# Patient Record
Sex: Male | Born: 1975 | Race: White | Hispanic: Refuse to answer | Smoking: Current some day smoker
Health system: Southern US, Community
[De-identification: ages and names within clinical notes are randomized; demographics above are authoritative.]

## PROBLEM LIST (undated history)

## (undated) DIAGNOSIS — F419 Anxiety disorder, unspecified: Secondary | ICD-10-CM

## (undated) HISTORY — DX: Anxiety disorder, unspecified: F41.9

---

## 2001-08-13 DIAGNOSIS — M25512 Pain in left shoulder: Secondary | ICD-10-CM | POA: Insufficient documentation

## 2016-06-19 ENCOUNTER — Other Ambulatory Visit (HOSPITAL_COMMUNITY): Payer: Self-pay | Admitting: Internal Medicine

## 2016-06-19 ENCOUNTER — Ambulatory Visit (HOSPITAL_COMMUNITY)
Admission: RE | Admit: 2016-06-19 | Discharge: 2016-06-19 | Disposition: A | Discharge status: Home / Self Care | Payer: BLUE CROSS/BLUE SHIELD | Source: Ambulatory Visit | Attending: Internal Medicine | Admitting: Internal Medicine

## 2016-06-19 DIAGNOSIS — R5383 Other fatigue: Secondary | ICD-10-CM

## 2016-06-19 DIAGNOSIS — R0789 Other chest pain: Secondary | ICD-10-CM | POA: Insufficient documentation

## 2016-12-18 ENCOUNTER — Other Ambulatory Visit: Payer: Self-pay | Admitting: Internal Medicine

## 2016-12-18 DIAGNOSIS — R1084 Generalized abdominal pain: Secondary | ICD-10-CM

## 2016-12-27 ENCOUNTER — Ambulatory Visit
Admission: RE | Admit: 2016-12-27 | Discharge: 2016-12-27 | Disposition: A | Discharge status: Home / Self Care | Payer: No Typology Code available for payment source | Source: Ambulatory Visit | Attending: Internal Medicine | Admitting: Internal Medicine

## 2016-12-27 DIAGNOSIS — R1084 Generalized abdominal pain: Secondary | ICD-10-CM

## 2019-02-19 ENCOUNTER — Ambulatory Visit (INDEPENDENT_AMBULATORY_CARE_PROVIDER_SITE_OTHER): Payer: Self-pay | Admitting: Otolaryngology

## 2019-02-19 DIAGNOSIS — D3709 Neoplasm of uncertain behavior of other specified sites of the oral cavity: Secondary | ICD-10-CM

## 2019-09-09 ENCOUNTER — Other Ambulatory Visit: Payer: Self-pay

## 2019-09-09 ENCOUNTER — Encounter (INDEPENDENT_AMBULATORY_CARE_PROVIDER_SITE_OTHER): Payer: Self-pay | Admitting: Internal Medicine

## 2019-09-09 ENCOUNTER — Ambulatory Visit (INDEPENDENT_AMBULATORY_CARE_PROVIDER_SITE_OTHER): Payer: Self-pay | Admitting: Internal Medicine

## 2019-09-09 VITALS — BP 132/84 | HR 78 | Temp 98.0°F | Resp 18 | Ht 72.0 in | Wt 202.0 lb

## 2019-09-09 DIAGNOSIS — F410 Panic disorder [episodic paroxysmal anxiety] without agoraphobia: Secondary | ICD-10-CM

## 2019-09-09 DIAGNOSIS — F419 Anxiety disorder, unspecified: Secondary | ICD-10-CM

## 2019-09-09 MED ORDER — LORAZEPAM 0.5 MG PO TABS: 0.5000 mg | ORAL_TABLET | Freq: Two times a day (BID) | ORAL | 1 refills | Status: DC | PRN

## 2019-09-09 MED ORDER — ESCITALOPRAM OXALATE 5 MG PO TABS: 5.0000 mg | ORAL_TABLET | Freq: Every day | ORAL | 3 refills | Status: DC

## 2019-09-09 NOTE — Progress Notes (Signed)
Metrics: Intervention Frequency ACO  Documented Smoking Status Yearly  Screened one or more times in 24 months  Cessation Counseling or  Active cessation medication Past 24 months  Past 24 months   Guideline developer: UpToDate (See UpToDate for funding source) Date Released: 2014       Wellness Office Visit  Subjective:  Patient ID: Benjamin Waters, male    DOB: Nov 06, 1975  Age: 44 y.o. MRN: [id number]  CC: This is an acute visit with a chief complaint of anxiety. HPI  For the last several months, during the COVID-19 pandemic especially, the patient has been experiencing episodes of severe anxiety with panic attacks.  He notices palpitations, extreme anxiety, feels sometimes he is going to die.  The episodes usually are short lived and they improve and he has had these in the past and taken benzodiazepines with significant relief. 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  His fiance makes minimal income and together they are just about able to pay the mortgage.  They also have a son who is 25 months old now. He has never been in a depressed state that he is suicidal. History reviewed. No pertinent past medical history.    History reviewed. No pertinent family history.  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   Social History   Tobacco Use  . Smoking status: Former Smoker    Quit date: 09/09/2015    Years since quitting: 4.0  . Smokeless tobacco: Never Used  Substance Use Topics  . Alcohol use: Not on file    No outpatient medications have been marked as taking for the 09/09/19 encounter (Office Visit) with Doree Albee, MD.      Objective:   Today's Vitals: BP 132/84 (BP Location: Right Arm, Patient Position: Sitting, Cuff Size: Normal)   Pulse 78   Temp 98 F (36.7 C)   Resp 18   Wt 202  lb (91.6 kg)   SpO2 98% Comment: wearing a mask. Vitals with BMI 09/09/2019  Weight 123XX123 lbs  Systolic [id number]  Diastolic 84  Pulse 78     Physical Exam  He looks systemically well.  He appears to be calm during the visit.     Assessment   1. Anxiety   2. Panic attack       Tests ordered No orders of the defined types were placed in this encounter.    Plan: 1.  I meant to prescribe him Lexapro 5 mg daily to help him with ongoing anxiety and also lorazepam when he gets severe panic attacks.  He understands lorazepam cannot be addictive and he will try not to take more than absolutely necessary. 2.  He will follow-up with me as needed and he knows that he needs to schedule an annual physical when he is able from a financial standpoint.     Meds ordered this encounter  Medications  . escitalopram (LEXAPRO) 5 MG tablet    Sig: Take 1 tablet (5 mg total) by mouth daily.    Dispense:  30 tablet    Refill:  3  . LORazepam (ATIVAN) 0.5 MG tablet    Sig: Take 1 tablet (0.5 mg total) by mouth 2 (two) times daily as needed for anxiety.    Dispense:  30 tablet    Refill:  1  Doree Albee, MD

## 2019-09-09 NOTE — Patient Instructions (Signed)
'  MATING IN CAPTIVITY'

## 2020-02-23 ENCOUNTER — Ambulatory Visit (INDEPENDENT_AMBULATORY_CARE_PROVIDER_SITE_OTHER): Payer: Self-pay | Admitting: Internal Medicine

## 2020-02-23 ENCOUNTER — Other Ambulatory Visit: Payer: Self-pay

## 2020-02-23 ENCOUNTER — Encounter (INDEPENDENT_AMBULATORY_CARE_PROVIDER_SITE_OTHER): Payer: Self-pay | Admitting: Internal Medicine

## 2020-02-23 VITALS — BP 120/80 | HR 72 | Temp 97.5°F | Ht 74.0 in | Wt 216.2 lb

## 2020-02-23 DIAGNOSIS — Z0000 Encounter for general adult medical examination without abnormal findings: Secondary | ICD-10-CM

## 2020-02-23 MED ORDER — LORAZEPAM 0.5 MG PO TABS: 0.5000 mg | ORAL_TABLET | Freq: Two times a day (BID) | ORAL | 1 refills | Status: DC | PRN

## 2020-02-23 NOTE — Progress Notes (Signed)
Chief Complaint: This 44 year old man comes in for an annual physical exam. HPI: He is doing reasonably well.  He cannot tolerate Lexapro and he takes lorazepam maybe twice a week when he really gets significant anxiety. Otherwise, he does not have any major complaints.  Past Medical History:  Diagnosis Date   Anxiety    History reviewed. No pertinent surgical history.   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    Social History   Tobacco Use   Smoking status: Former Smoker    Quit date: 09/09/2015    Years since quitting: 4.4   Smokeless tobacco: Never Used  Substance Use Topics   Alcohol use: Yes    Alcohol/week: 10.0 standard drinks    Types: 10 Cans of beer per week      Allergies: No Known Allergies   Current Meds  Medication Sig   LORazepam (ATIVAN) 0.5 MG tablet Take 1 tablet (0.5 mg total) by mouth 2 (two) times daily as needed for anxiety.   [DISCONTINUED] LORazepam (ATIVAN) 0.5 MG tablet Take 1 tablet (0.5 mg total) by mouth 2 (two) times daily as needed for anxiety.      Depression screen PHQ 2/9 02/23/2020  Decreased Interest 0  Down, Depressed, Hopeless 0  PHQ - 2 Score 0     WUG:QBVQX from the symptoms mentioned above,there are no other symptoms referable to all systems reviewed.       Physical Exam: Blood pressure 120/80, pulse 72, temperature (!) 97.5 F (36.4 C), temperature source Temporal, height 6\' 2"  (1.88 m), weight 216 lb 3.2 oz (98.1 kg), SpO2 96 %. Vitals with BMI 02/23/2020 09/09/2019  Height 6\' 2"  6\' 0"   Weight 216 lbs 3 oz 202 lbs  BMI 45.03 88.82  Systolic 800 349  Diastolic 80 84  Pulse 72 78      He looks systemically well.  He is overweight.  He has gained 14 pounds since January of this year. General: Alert, cooperative, and appears to be the stated age.No pallor.  No jaundice.  No clubbing. Head: Normocephalic Eyes:  Sclera white, pupils equal and reactive to light, red reflex x 2,  Ears: Normal bilaterally Oral cavity: Lips, mucosa, and tongue normal: Teeth and gums normal Neck: No adenopathy, supple, symmetrical, trachea midline, and thyroid does not appear enlarged Respiratory: Clear to auscultation bilaterally.No wheezing, crackles or bronchial breathing. Cardiovascular: Heart sounds are present and appear to be normal without murmurs or added sounds.  No carotid bruits.  Peripheral pulses are present and equal bilaterally.: Gastrointestinal:positive bowel sounds, no hepatosplenomegaly.  No masses felt.No tenderness. Skin: Clear, No rashes noted.No worrisome skin lesions seen. Neurological: Grossly intact without focal findings, cranial nerves II through XII intact, muscle strength equal bilaterally Musculoskeletal: No acute joint abnormalities noted.Full range of movement noted with joints. Psychiatric: Affect appropriate, non-anxious.    Assessment  1. Routine general medical examination at a health care facility     Tests Ordered:   Orders Placed This Encounter  Procedures   CBC   COMPLETE METABOLIC PANEL WITH GFR   Lipid panel     Plan  1. Fairly healthy 44 year old man but is overweight and he realizes he needs to lose weight. 2. I have refilled his lorazepam. 3. Further recommendations will depend on blood results. 4. I will see him in 1 year for an annual physical exam.     Meds ordered this encounter  Medications   LORazepam (ATIVAN) 0.5 MG tablet    Sig: Take 1 tablet (0.5 mg total) by mouth 2 (two) times daily as needed for anxiety.    Dispense:  30 tablet    Refill:  1     Keyari Kleeman C Vito Beg   02/23/2020, 9:21 AM

## 2020-02-24 LAB — CBC
HCT: 44.9 % (ref 38.5–50.0)
Hemoglobin: 14.7 g/dL (ref 13.2–17.1)
MCH: 30.9 pg (ref 27.0–33.0)
MCHC: 32.7 g/dL (ref 32.0–36.0)
MCV: 94.3 fL (ref 80.0–100.0)
MPV: 10.1 fL (ref 7.5–12.5)
Platelets: 216 10*3/uL (ref 140–400)
RBC: 4.76 10*6/uL (ref 4.20–5.80)
RDW: 13.3 % (ref 11.0–15.0)
WBC: 5.7 10*3/uL (ref 3.8–10.8)

## 2020-02-24 LAB — COMPLETE METABOLIC PANEL WITH GFR
AG Ratio: 1.9 (calc) (ref 1.0–2.5)
ALT: 21 U/L (ref 9–46)
AST: 15 U/L (ref 10–40)
Albumin: 4.2 g/dL (ref 3.6–5.1)
Alkaline phosphatase (APISO): 47 U/L (ref 36–130)
BUN: 11 mg/dL (ref 7–25)
CO2: 25 mmol/L (ref 20–32)
Calcium: 9.2 mg/dL (ref 8.6–10.3)
Chloride: 106 mmol/L (ref 98–110)
Creat: 0.97 mg/dL (ref 0.60–1.35)
GFR, Est African American: 111 mL/min/{1.73_m2} (ref >=60)
GFR, Est Non African American: 96 mL/min/{1.73_m2} (ref >=60)
Globulin: 2.2 g/dL (calc) (ref 1.9–3.7)
Glucose, Bld: 92 mg/dL (ref 65–99)
Potassium: 4.1 mmol/L (ref 3.5–5.3)
Sodium: 139 mmol/L (ref 135–146)
Total Bilirubin: 0.5 mg/dL (ref 0.2–1.2)
Total Protein: 6.4 g/dL (ref 6.1–8.1)

## 2020-02-24 LAB — LIPID PANEL
Cholesterol: 144 mg/dL (ref <200)
HDL: 29 mg/dL — ABNORMAL LOW (ref >=40)
LDL Cholesterol (Calc): 89 mg/dL (calc)
Non-HDL Cholesterol (Calc): 115 mg/dL (calc) (ref <130)
Total CHOL/HDL Ratio: 5 (calc) — ABNORMAL HIGH (ref <5.0)
Triglycerides: 164 mg/dL — ABNORMAL HIGH (ref <150)

## 2020-02-24 NOTE — Progress Notes (Signed)
Please call this patient and let him know that his blood tests are within normal range and cholesterol is not too elevated.  His HDL cholesterol is low.  Exercise and following a healthy diet will help this.  Follow-up as scheduled.

## 2020-02-24 NOTE — Progress Notes (Signed)
Patient called.  Given lab results. Pt stated he is going to do the exercises and diet.

## 2020-05-31 ENCOUNTER — Other Ambulatory Visit (INDEPENDENT_AMBULATORY_CARE_PROVIDER_SITE_OTHER): Payer: Self-pay | Admitting: Internal Medicine

## 2020-07-20 ENCOUNTER — Other Ambulatory Visit (INDEPENDENT_AMBULATORY_CARE_PROVIDER_SITE_OTHER): Payer: Self-pay | Admitting: Internal Medicine

## 2020-09-19 ENCOUNTER — Other Ambulatory Visit (INDEPENDENT_AMBULATORY_CARE_PROVIDER_SITE_OTHER): Payer: Self-pay | Admitting: Internal Medicine

## 2020-11-14 ENCOUNTER — Other Ambulatory Visit (INDEPENDENT_AMBULATORY_CARE_PROVIDER_SITE_OTHER): Payer: Self-pay | Admitting: Internal Medicine

## 2021-02-27 ENCOUNTER — Encounter (INDEPENDENT_AMBULATORY_CARE_PROVIDER_SITE_OTHER): Payer: Medicaid Other | Admitting: Internal Medicine

## 2021-03-16 ENCOUNTER — Encounter (INDEPENDENT_AMBULATORY_CARE_PROVIDER_SITE_OTHER): Payer: Self-pay

## 2021-03-22 ENCOUNTER — Encounter (INDEPENDENT_AMBULATORY_CARE_PROVIDER_SITE_OTHER): Payer: Medicaid Other | Admitting: Nurse Practitioner

## 2021-05-31 ENCOUNTER — Ambulatory Visit (INDEPENDENT_AMBULATORY_CARE_PROVIDER_SITE_OTHER): Payer: Medicaid Other | Admitting: Internal Medicine

## 2021-05-31 ENCOUNTER — Other Ambulatory Visit: Payer: Self-pay

## 2021-05-31 ENCOUNTER — Encounter: Payer: Self-pay | Admitting: Internal Medicine

## 2021-05-31 VITALS — BP 138/84 | HR 69 | Temp 98.0°F | Resp 16 | Ht 74.0 in | Wt 201.0 lb

## 2021-05-31 DIAGNOSIS — F411 Generalized anxiety disorder: Secondary | ICD-10-CM | POA: Diagnosis not present

## 2021-05-31 DIAGNOSIS — Z7689 Persons encountering health services in other specified circumstances: Secondary | ICD-10-CM | POA: Insufficient documentation

## 2021-05-31 DIAGNOSIS — J309 Allergic rhinitis, unspecified: Secondary | ICD-10-CM | POA: Diagnosis not present

## 2021-05-31 DIAGNOSIS — K5904 Chronic idiopathic constipation: Secondary | ICD-10-CM | POA: Insufficient documentation

## 2021-05-31 DIAGNOSIS — R569 Unspecified convulsions: Secondary | ICD-10-CM | POA: Insufficient documentation

## 2021-05-31 DIAGNOSIS — Z8673 Personal history of transient ischemic attack (TIA), and cerebral infarction without residual deficits: Secondary | ICD-10-CM | POA: Insufficient documentation

## 2021-05-31 MED ORDER — CETIRIZINE HCL 10 MG PO TABS: 10.0000 mg | ORAL_TABLET | Freq: Every day | ORAL | 3 refills | Status: DC

## 2021-05-31 MED ORDER — FLUTICASONE PROPIONATE 50 MCG/ACT NA SUSP: 2.0000 | Freq: Every day | NASAL | 6 refills | Status: DC

## 2021-05-31 NOTE — Assessment & Plan Note (Signed)
Started Zyrtec 10 mg PRN Started Flonase

## 2021-05-31 NOTE — Assessment & Plan Note (Signed)
In 2008, had right sided hamiparesis and hemiplegia - lasted for 3 months, reports that he could not see Neurologist as he did not have insurance - did not have any imaging Advised to start Aspirin for now Referred to Neurology - possibly will need MRI brain to confirm old CVA - if evident, will start statin

## 2021-05-31 NOTE — Patient Instructions (Signed)
Please take Zyrtec and use Flonase for allergic sinusitis.  Please use Senna or Colace for constipation. Try to take at least 64 ounces of fluid in a day.  Start taking Aspirin 81 mg once a day.  You are being referred to Neurology.

## 2021-05-31 NOTE — Assessment & Plan Note (Signed)
Likely panic episodes - Ativan PRN Did not tolerate SSRI in the past

## 2021-05-31 NOTE — Assessment & Plan Note (Signed)
Care established History and medications reviewed with the patient 

## 2021-05-31 NOTE — Progress Notes (Signed)
New Patient Office Visit  Subjective:  Patient ID: Benjamin Waters, male    DOB: 1975-12-08  Age: 45 y.o. MRN: [id number]  CC:  Chief Complaint  Patient presents with   New Patient (Initial Visit)    New patient was seeing dr Benjamin Waters pt has had sinus congestion for last 2 months runny nose and thick mucous     HPI Benjamin Waters is a 45 year old male with PMH of GAD/panic disorder, ?  CVA and allergic sinusitis who presents for establishing care.  He has history of panic episodes, but does not recall any triggers.  He denies any dyspnea during the episode, but feels enclosed in a space, and has headache, chest pain, diffuse muscle aches and blurry vision. He also reports clenching of his hands and jerking of UE. Of note, he takes Ativan for such episodes, which arguable can treat episodes of seizure like movement as well. He has had these episodes for more than 5 years, but has been getting progressively worse and more frequent.  He also reports an episode of facial weakness and numbness in 2008, for which he went to Urgent care and was told of Bell's palsy. But later, he had right sided hemiparesis and hemiplegia (?), but did not get any medical attention as he did not have any insurance at that time. He had weakness or right UE and LE for almost 3 months. He has gained his strength back since then. Denies any numbness or tingling currently.  He c/o chronic nasal congestion and thick nasal discharge at times. He also sinus pressure at times. Denies any fever, chills, dyspnea or wheezing.  He also c/o chronic constipation, but denies any melena. He has had an episode of rectal bleeding few weeks ago, where he noticed blood on tissue paper. He also reports remote history of hemorrhoids.  He has had 2 doses of COVID vaccine.  Past Medical History:  Diagnosis Date   Anxiety     History reviewed. No pertinent surgical history.  Family History  Problem Relation Age of Onset   Lung  cancer Father    Hypertension Sister    Cancer Sister    Drug abuse Sister     Social History   Socioeconomic History   Marital status: Soil scientist    Spouse name: Not on file   Number of children: Not on file   Years of education: Not on file   Highest education level: Not on file  Occupational History   Not on file  Tobacco Use   Smoking status: Former    Types: Cigarettes    Quit date: 09/09/2015    Years since quitting: 5.7   Smokeless tobacco: Never  Substance and Sexual Activity   Alcohol use: Yes    Alcohol/week: 10.0 standard drinks    Types: 10 Cans of beer per week   Drug use: Not Currently   Sexual activity: Yes  Other Topics Concern   Not on file  Social History Narrative   Benjamin Waters of 11 years.Lives with fiancee.Currently unemployed.Thinking of going into IT.   Social Determinants of Health   Financial Resource Strain: Not on file  Food Insecurity: Not on file  Transportation Needs: Not on file  Physical Activity: Not on file  Stress: Not on file  Social Connections: Not on file  Intimate Partner Violence: Not on file    ROS Review of Systems  Constitutional:  Negative for chills and fever.  HENT:  Positive for congestion, postnasal drip and  sinus pressure. Negative for sore throat.   Eyes:  Negative for pain and discharge.  Respiratory:  Negative for cough and shortness of breath.   Cardiovascular:  Negative for chest pain and palpitations.  Gastrointestinal:  Positive for constipation. Negative for diarrhea, nausea and vomiting.  Endocrine: Negative for polydipsia and polyuria.  Genitourinary:  Negative for dysuria and hematuria.  Musculoskeletal:  Negative for neck pain and neck stiffness.  Skin:  Negative for rash.  Neurological:  Positive for dizziness and weakness. Negative for numbness and headaches.  Psychiatric/Behavioral:  Negative for agitation and behavioral problems. The patient is nervous/anxious.    Objective:   Today's  Vitals: BP 138/84 (BP Location: Left Arm, Cuff Size: Normal)   Pulse 69   Temp 98 F (36.7 C) (Oral)   Resp 16   Ht 6\' 2"  (1.88 m)   Wt 201 lb 0.6 oz (91.2 kg)   SpO2 96%   BMI 25.81 kg/m   Physical Exam Vitals reviewed.  Constitutional:      General: He is not in acute distress.    Appearance: He is not diaphoretic.  HENT:     Head: Normocephalic and atraumatic.     Nose: Nose normal.     Mouth/Throat:     Mouth: Mucous membranes are moist.  Eyes:     General: No scleral icterus.    Extraocular Movements: Extraocular movements intact.  Cardiovascular:     Rate and Rhythm: Normal rate and regular rhythm.     Pulses: Normal pulses.     Heart sounds: Normal heart sounds. No murmur heard. Pulmonary:     Breath sounds: Normal breath sounds. No wheezing or rales.  Abdominal:     Palpations: Abdomen is soft.     Tenderness: There is no abdominal tenderness.  Musculoskeletal:     Cervical back: Neck supple. No tenderness.     Right lower leg: No edema.     Left lower leg: No edema.  Skin:    General: Skin is warm.     Findings: No rash.  Neurological:     General: No focal deficit present.     Mental Status: He is alert and oriented to person, place, and time.     Cranial Nerves: No cranial nerve deficit.     Sensory: No sensory deficit.     Motor: No weakness.  Psychiatric:        Mood and Affect: Mood normal.        Behavior: Behavior normal.    Assessment & Plan:   Problem List Items Addressed This Visit       Encounter to establish care - Primary   Care established History and medications reviewed with the patient     GAD (generalized anxiety disorder)   Likely panic episodes - Ativan PRN Did not tolerate SSRI in the past     Seizure-like activity ([place])   Certain symptoms during panic episodes suggestive of seizure-like activity Referred to Neurology for possible need for EEG     Relevant Orders  Ambulatory referral to Neurology  History of CVA  (cerebrovascular accident)   In 2008, had right sided hamiparesis and hemiplegia - lasted for 3 months, reports that he could not see Neurologist as he did not have insurance - did not have any imaging Advised to start Aspirin for now Referred to Neurology - possibly will need MRI brain to confirm old CVA - if evident, will start statin     Relevant Orders  Ambulatory  referral to Neurology    Respiratory   Allergic sinusitis    Started Zyrtec 10 mg PRN Started Flonase      Relevant Medications   cetirizine (ZYRTEC) 10 MG tablet   fluticasone (FLONASE) 50 MCG/ACT nasal spray     Digestive   Chronic idiopathic constipation    Advised to take Colace or senna PRN Increase fluid intake        Outpatient Encounter Medications as of 05/31/2021  Medication Sig   cetirizine (ZYRTEC) 10 MG tablet Take 1 tablet (10 mg total) by mouth daily.   fluticasone (FLONASE) 50 MCG/ACT nasal spray Place 2 sprays into both nostrils daily.   LORazepam (ATIVAN) 0.5 MG tablet Take 1 tablet by mouth twice daily as needed for anxiety   No facility-administered encounter medications on file as of 05/31/2021.    Follow-up: Return in about 6 months (around 11/29/2021).   Lindell Spar, MD

## 2021-05-31 NOTE — Assessment & Plan Note (Signed)
Advised to take Colace or senna PRN Increase fluid intake

## 2021-05-31 NOTE — Assessment & Plan Note (Signed)
Certain symptoms during panic episodes suggestive of seizure-like activity Referred to Neurology for possible need for EEG

## 2021-06-02 ENCOUNTER — Telehealth: Payer: Self-pay | Admitting: Internal Medicine

## 2021-06-02 ENCOUNTER — Other Ambulatory Visit: Payer: Self-pay | Admitting: Internal Medicine

## 2021-06-02 DIAGNOSIS — F411 Generalized anxiety disorder: Secondary | ICD-10-CM

## 2021-06-02 MED ORDER — LORAZEPAM 0.5 MG PO TABS: 0.5000 mg | ORAL_TABLET | Freq: Two times a day (BID) | ORAL | 3 refills | Status: DC | PRN

## 2021-06-02 NOTE — Telephone Encounter (Signed)
Pt called in for refill on Lorazepam  [facility] needs authorization to fill

## 2021-06-05 ENCOUNTER — Ambulatory Visit: Payer: Medicaid Other | Admitting: Neurology

## 2021-06-05 ENCOUNTER — Encounter: Payer: Self-pay | Admitting: Neurology

## 2021-06-05 ENCOUNTER — Other Ambulatory Visit: Payer: Self-pay

## 2021-06-05 VITALS — BP 112/66 | HR 73 | Ht 74.0 in | Wt 206.2 lb

## 2021-06-05 DIAGNOSIS — R251 Tremor, unspecified: Secondary | ICD-10-CM

## 2021-06-05 DIAGNOSIS — R531 Weakness: Secondary | ICD-10-CM | POA: Diagnosis not present

## 2021-06-05 NOTE — Progress Notes (Signed)
NEUROLOGY CONSULTATION NOTE  Makell Cyr MRN: [id number] DOB: 1976-06-03  Referring provider: Dr. Ihor Dow Primary care provider: Dr. Ihor Dow  Reason for consult:  seizure-like activity  Dear Dr Posey Pronto:  Thank you for your kind referral of Demarkus Remmel for consultation of the above symptoms. Although his history is well known to you, please allow me to reiterate it for the purpose of our medical record. He is alone in the office today. Records and images were personally reviewed where available.   HISTORY OF PRESENT ILLNESS: This is a pleasant 45 year old ambidextrous right hand dominant man with a history of GAD/panic disorder, presenting for evaluation of seizure-like activity. He started having intermittent episodes around 5 years ago, that became progressively worse that they were occurring daily close to 2 years ago. He can sometimes tell when they would start, he would feel weird with a physical sensation that something is wrong with him. Sometimes there is a pressure on the left side of his chest and he feels his heart beating fast or irregularly. He has a wrist BP cuff at home and it would show an irregular rhythm. His upper body would then stiffen/contract with low amplitude shaking. His fists may clench. He has to consciously focus on his breathing and relax his body, which may or may not help. There would be no loss of consciousness but a few times he felt like he would pass out when he had gotten lightheaded. There was one time a few days ago where he had a weird taste in his mouth like metal and he noticed he was feeling nervous/anxious with sweat on his face. He attributed to the taste to shaving cream in his mouth. He was prescribed Ativan close to 2 years ago which has helped a lot. Episodes are not occurring daily, he has around 1-2 a month. No nocturnal episodes. He is able to catch it now and take the Ativan, if he does not take it soon enough, he takes an  additional dose. In the past 3 weeks, he has taken 10-14 tablets. He notices random muscle twitches on his chest, thumb, or different parts of his body. He injured his left shoulder which also causes some anxiety. He lives with his partner, 2 year old son, and 11yo nephew, and has not been told of any staring/unresponsive episodes. He denies any loss of time. He denies any significant headaches, dizziness, diplopia, dysarthria/dysphagia, bladder dysfunction. He has neck/back pain and constipation.   He reports an incident in 2008 when he woke up with the right side of his mouth paralyzed. He went to Urgent Care where "they thought it was Bell's palsy" and prescribed Prednisone. Over the next 1-2 days, he had progressive weakness on the right arm then right leg where he needed to pull his right leg up or use his left arm to lift the right. He could move his right hand but there was no feeling in it, he could not write with it. It took 3 months to improve, and was normal in 6 months. He still notices a little droop on the right side of his mouth when smiling and coordination on the right hand is not as good, but feels 98% better. He has occasional numbness and tingling in his fingers and toes. No falls. Sleep is good. Mood "depends on the day," he reports being very stressed. 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 Otherwise he had a normal birth and early  development.  There is no history of febrile convulsions, CNS infections such as meningitis/encephalitis, significant traumatic brain injuries, neurosurgical procedures.    PAST MEDICAL HISTORY: Past Medical History:  Diagnosis Date   Anxiety     PAST SURGICAL HISTORY: History reviewed. No pertinent surgical history.  MEDICATIONS: Current Outpatient Medications on File Prior to Visit  Medication Sig Dispense Refill   aspirin EC 81 MG tablet Take 81 mg by mouth daily. Swallow whole.     cetirizine (ZYRTEC) 10 MG tablet Take 1  tablet (10 mg total) by mouth daily. 30 tablet 3   fluticasone (FLONASE) 50 MCG/ACT nasal spray Place 2 sprays into both nostrils daily. 16 g 6   LORazepam (ATIVAN) 0.5 MG tablet Take 1 tablet (0.5 mg total) by mouth 2 (two) times daily as needed. for anxiety 30 tablet 3   No current facility-administered medications on file prior to visit.    ALLERGIES: No Known Allergies  FAMILY HISTORY: Family History  Problem Relation Age of Onset   Lung cancer Father    Hypertension Sister    Cancer Sister    Drug abuse Sister     SOCIAL HISTORY: Social History   Socioeconomic History   Marital status: Soil scientist    Spouse name: Not on file   Number of children: Not on file   Years of education: Not on file   Highest education level: Not on file  Occupational History   Not on file  Tobacco Use   Smoking status: Former    Types: Cigarettes    Quit date: 09/09/2015    Years since quitting: 5.7   Smokeless tobacco: Never  Substance and Sexual Activity   Alcohol use: Yes    Alcohol/week: 10.0 standard drinks    Types: 10 Cans of beer per week   Drug use: Not Currently   Sexual activity: Yes  Other Topics Concern   Not on file  Social History Narrative   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   Social Determinants of Health   Financial Resource Strain: Not on file  Food Insecurity: Not on file  Transportation Needs: Not on file  Physical Activity: Not on file  Stress: Not on file  Social Connections: Not on file  Intimate Partner Violence: Not on file     PHYSICAL EXAM: Vitals:   06/05/21 0850  BP: 112/66  Pulse: 73  SpO2: 99%   General: No acute distress Head:  Normocephalic/atraumatic Skin/Extremities: No rash, no edema Neurological Exam: Mental status: alert and oriented to person, place, and time, no dysarthria or aphasia, Fund of knowledge is appropriate.  Recent and remote memory are intact, 3/3 delayed  recall.  Attention and concentration are normal, 5/5 WORLD backwards.  Cranial nerves: CN I: not tested CN II: pupils equal, round and reactive to light, visual fields intact CN III, IV, VI:  full range of motion, no nystagmus, no ptosis CN V: facial sensation intact CN VII: upper and lower face symmetric CN VIII: hearing intact to conversation Bulk & Tone: normal, no fasciculations. Motor: 5/5 throughout with no pronator drift, good fine finger movements Sensation: intact to light touch, cold, pin, vibration sense.  No extinction to double simultaneous stimulation.  Romberg test negative Deep Tendon Reflexes: +2 throughout, no ankle clonus Plantar responses: downgoing bilaterally Cerebellar: no incoordination on finger to nose testing Gait: narrow-based and steady, able to tandem walk adequately. Tremor: none   IMPRESSION: This is a pleasant 45 year old  ambidextrous right hand dominant man with a history of GAD/panic disorder, presenting for evaluation of seizure-like activity. History reviewed, he reports a prolonged episode of right-sided weakness in 2008 involving the right face/arm/leg but did not seek medical attention at that time. The episodes he describes are more suggestive of anxiety-related events, we discussed carpopedal spasms (in his case mostly involving the upper body/hands), less likely seizure. He has around 1-2 episodes a month now. MRI brain without contrast will be ordered to assess for underlying structural abnormality, he is now on daily aspirin. EEG will be done for completion. Our office will call with results, if normal, follow-up as needed, he knows to call for any changes.   Thank you for allowing me to participate in the care of this patient. Please do not hesitate to call for any questions or concerns.   Ellouise Newer, M.D.  CC: Dr. Posey Pronto

## 2021-06-05 NOTE — Patient Instructions (Signed)
Good to meet you!  Schedule MRI brain without contrast  2. Schedule routine EEG  3. Our office will call with results, call for any changes

## 2021-06-21 ENCOUNTER — Ambulatory Visit: Payer: Medicaid Other | Admitting: Neurology

## 2021-06-21 ENCOUNTER — Other Ambulatory Visit: Payer: Self-pay

## 2021-06-21 DIAGNOSIS — R251 Tremor, unspecified: Secondary | ICD-10-CM

## 2021-06-21 DIAGNOSIS — R531 Weakness: Secondary | ICD-10-CM

## 2021-06-22 NOTE — Procedures (Signed)
ELECTROENCEPHALOGRAM REPORT  Date of Study: 06/21/2021  Patient's Name: Benjamin Waters MRN: [id number] Date of Birth: 1976/07/29  Referring Provider: Dr. Ellouise Newer  Clinical History: This is a 45 year old man with episodes of weird feeling with palpitations, upper body would contract/stiffen with slight shaking, fists clenched. EEG for classification.  Medications: ATIVAN 0.5 MG tablet aspirin EC 81 MG tablet ZYRTEC 10 MG tablet FLONASE 50 MCG/ACT nasal spray   Technical Summary: A multichannel digital recording measured by the international 10-20 system with electrodes applied with paste and impedances below 5000 ohms performed in our laboratory with EKG monitoring in an awake and askeeo patient.  Hyperventilation was not performed. Photic stimulation was performed.  The digital EEG was referentially recorded, reformatted, and digitally filtered in a variety of bipolar and referential montages for optimal display.    Description: The patient is awake and asleep during the recording.  During maximal wakefulness, there is a symmetric, medium voltage 10 Hz posterior dominant rhythm that attenuates with eye opening.  The record is symmetric.  During drowsiness and stage I sleep, there is an increase in theta slowing of the background with vertex waves seen.  Photic stimulation did not elicit any abnormalities.  There were no epileptiform discharges or electrographic seizures seen.    EKG lead was unremarkable.  Impression: This awake and asleep EEG is normal.    Clinical Correlation: A normal EEG does not exclude a clinical diagnosis of epilepsy.  If further clinical questions remain, prolonged EEG may be helpful.  Clinical correlation is advised.   Ellouise Newer, M.D.

## 2021-06-26 ENCOUNTER — Telehealth: Payer: Self-pay

## 2021-06-26 NOTE — Telephone Encounter (Signed)
-----   Message from Cameron Sprang, MD sent at 06/26/2021  8:32 AM EST ----- Pls let him know the brain wave test was normal, proceed with MRI as scheduled, thanks

## 2021-06-26 NOTE — Telephone Encounter (Signed)
Pt called no answer per DPR left a voice mail brain wave test was normal, proceed with MRI as scheduled. Any questions or concerns call the office back at [phone]

## 2021-07-08 ENCOUNTER — Other Ambulatory Visit: Payer: Self-pay

## 2021-07-08 ENCOUNTER — Ambulatory Visit
Admission: RE | Admit: 2021-07-08 | Discharge: 2021-07-08 | Disposition: A | Discharge status: Home / Self Care | Payer: Medicaid Other | Source: Ambulatory Visit | Attending: Neurology | Admitting: Neurology

## 2021-07-08 DIAGNOSIS — R251 Tremor, unspecified: Secondary | ICD-10-CM

## 2021-07-08 DIAGNOSIS — R531 Weakness: Secondary | ICD-10-CM

## 2021-07-14 ENCOUNTER — Encounter: Payer: Self-pay | Admitting: Neurology

## 2021-07-20 ENCOUNTER — Telehealth: Payer: Self-pay

## 2021-07-20 NOTE — Telephone Encounter (Signed)
Pt called an informed per Dr Delice Lesch taht she is  out of the office and will call him on Monday, but that typically when we see these changes on the MRI, we do a spinal tap to further clarify a diagnosis

## 2021-07-24 ENCOUNTER — Other Ambulatory Visit: Payer: Self-pay

## 2021-07-24 DIAGNOSIS — R251 Tremor, unspecified: Secondary | ICD-10-CM

## 2021-07-24 DIAGNOSIS — G35 Multiple sclerosis: Secondary | ICD-10-CM

## 2021-07-24 NOTE — Telephone Encounter (Signed)
Spoke to patient about MRI results concerning for demyelinating disease. Since his last visit, he has not had any new symptoms but after reading up on MS, he does notice numbness in tips of toes and fingers, fatigue, lightheadedness. He is still having the shaking (4 or 5 episodes). Discussed doing further workup for MS.  Heather, pls order: MRI cervical spine with and without contrast MRI thoracic spine with and without contrast Lumbar puncture under fluoro  CSF for cell count, glucose, protein CSF oligoclonal bands CSF IgG index CSF ACE    Thanks

## 2021-07-24 NOTE — Telephone Encounter (Signed)
Orders placed in Epic

## 2021-08-01 ENCOUNTER — Other Ambulatory Visit (HOSPITAL_COMMUNITY)
Admission: RE | Admit: 2021-08-01 | Discharge: 2021-08-01 | Disposition: A | Discharge status: Home / Self Care | Payer: Medicaid Other | Source: Ambulatory Visit | Attending: Neurology | Admitting: Neurology

## 2021-08-01 ENCOUNTER — Ambulatory Visit
Admission: RE | Admit: 2021-08-01 | Discharge: 2021-08-01 | Disposition: A | Discharge status: Home / Self Care | Payer: Medicaid Other | Source: Ambulatory Visit | Attending: Neurology | Admitting: Neurology

## 2021-08-01 VITALS — BP 121/67 | HR 55

## 2021-08-01 DIAGNOSIS — Z8673 Personal history of transient ischemic attack (TIA), and cerebral infarction without residual deficits: Secondary | ICD-10-CM | POA: Diagnosis not present

## 2021-08-01 DIAGNOSIS — R251 Tremor, unspecified: Secondary | ICD-10-CM | POA: Insufficient documentation

## 2021-08-01 DIAGNOSIS — G35 Multiple sclerosis: Secondary | ICD-10-CM | POA: Insufficient documentation

## 2021-08-01 DIAGNOSIS — H9319 Tinnitus, unspecified ear: Secondary | ICD-10-CM | POA: Insufficient documentation

## 2021-08-01 DIAGNOSIS — R569 Unspecified convulsions: Secondary | ICD-10-CM | POA: Diagnosis present

## 2021-08-01 DIAGNOSIS — G971 Other reaction to spinal and lumbar puncture: Secondary | ICD-10-CM

## 2021-08-01 HISTORY — DX: Other reaction to spinal and lumbar puncture: G97.1

## 2021-08-01 NOTE — Progress Notes (Signed)
1 vial of blood drawn from pts right AC by A. Melina Modena, Therapist, sports. To be sent off with LP lab work. 1 successful attempt. Pt tolerated well. Gauze and tape applied after.

## 2021-08-01 NOTE — Discharge Instructions (Signed)

## 2021-08-02 LAB — CYTOLOGY - NON PAP

## 2021-08-04 ENCOUNTER — Emergency Department (HOSPITAL_COMMUNITY)
Admission: EM | Admit: 2021-08-04 | Discharge: 2021-08-04 | Disposition: A | Discharge status: Home / Self Care | Payer: Medicaid Other | Attending: Emergency Medicine | Admitting: Emergency Medicine

## 2021-08-04 ENCOUNTER — Encounter (HOSPITAL_COMMUNITY): Payer: Self-pay | Admitting: *Deleted

## 2021-08-04 DIAGNOSIS — R519 Headache, unspecified: Secondary | ICD-10-CM | POA: Diagnosis not present

## 2021-08-04 DIAGNOSIS — G971 Other reaction to spinal and lumbar puncture: Secondary | ICD-10-CM

## 2021-08-04 DIAGNOSIS — G8918 Other acute postprocedural pain: Secondary | ICD-10-CM | POA: Insufficient documentation

## 2021-08-04 DIAGNOSIS — Z7982 Long term (current) use of aspirin: Secondary | ICD-10-CM | POA: Diagnosis not present

## 2021-08-04 DIAGNOSIS — F17210 Nicotine dependence, cigarettes, uncomplicated: Secondary | ICD-10-CM | POA: Insufficient documentation

## 2021-08-04 DIAGNOSIS — M542 Cervicalgia: Secondary | ICD-10-CM | POA: Diagnosis present

## 2021-08-04 MED ORDER — KETOROLAC TROMETHAMINE 15 MG/ML IJ SOLN
15.0000 mg | Freq: Once | INTRAMUSCULAR | Status: AC
  Administered 2021-08-04: 13:00:00 15 mg via INTRAVENOUS
  Filled 2021-08-04: qty 1

## 2021-08-04 MED ORDER — SODIUM CHLORIDE 0.9 % IV BOLUS
1000.0000 mL | Freq: Once | INTRAVENOUS | Status: AC
  Administered 2021-08-04: 13:00:00 1000 mL via INTRAVENOUS

## 2021-08-04 MED ORDER — SODIUM CHLORIDE 0.9 % IV SOLN: 500.0000 mg | Freq: Once | INTRAVENOUS | Status: DC

## 2021-08-04 MED ORDER — SODIUM CHLORIDE 0.9 % IV BOLUS: 1000.0000 mL | Freq: Once | INTRAVENOUS | Status: DC

## 2021-08-04 NOTE — ED Triage Notes (Signed)
States he had a spinal tap several days ago and the hole is not closed , state he is leaking fluid and cannot stand longer than 115 minutes

## 2021-08-04 NOTE — Discharge Instructions (Signed)
Continue drinking plenty of fluids at home.  He can take ibuprofen as needed for the headache.  Follow-up with a neurologist next week, give them a call Monday morning.  Symptoms should be improving over the weekend, if they worsen return

## 2021-08-04 NOTE — ED Notes (Signed)
Pt ambulated without assistance to restroom and back. Pt states he fees "much better". PA notified.

## 2021-08-04 NOTE — ED Notes (Signed)
Pt d/c home with visitor per MD order. Discharge summary reviewed , verbalize understanding. Ambulatory off unit with visitor. No s/s of acute distress noted.

## 2021-08-04 NOTE — ED Provider Notes (Signed)
[facility] Provider Note   CSN: [id number] Arrival date & time: 08/04/21  1154     History No chief complaint on file.   Benjamin Waters is a 45 y.o. male.  HPI  Patient with history of suspected MS followed by [facility] neurology presents due to headache and neck pain.  He had a lumbar procedure performed on 08/01/2021.  He has been having headaches and tightening of the neck and shoulders since the procedure.  There worsened by sitting forward, alleviated by lying down.  He has tried over-the-counter acetaminophen which has not really helped.  Also taking Ativan for muscle spasms which do alleviate the symptoms somewhat.  Has not followed up with neurology since the procedure, feels like there was "a blockage of the CSF from the needle".  There is no drainage or leaking from the any injection site, not having fevers at home.  Reports subjective chills but has been having them off and on for years.  States she is unable to ambulate for greater than 115 minutes due to weakness and severity of the headache.  Past Medical History:  Diagnosis Date   Anxiety     Patient Active Problem List   Diagnosis Date Noted   Encounter to establish care 05/31/2021   GAD (generalized anxiety disorder) 05/31/2021   Allergic sinusitis 05/31/2021   Chronic idiopathic constipation 05/31/2021   Seizure-like activity ([place]) 05/31/2021   History of CVA (cerebrovascular accident) 05/31/2021    History reviewed. No pertinent surgical history.     Family History  Problem Relation Age of Onset   Lung cancer Father    Hypertension Sister    Cancer Sister    Drug abuse Sister     Social History   Tobacco Use   Smoking status: Some Days    Types: Cigarettes    Last attempt to quit: 09/09/2015    Years since quitting: 5.9   Smokeless tobacco: Never  Vaping Use   Vaping Use: Never used  Substance Use Topics   Alcohol use: Yes    Alcohol/week: 10.0 standard drinks     Types: 10 Cans of beer per week   Drug use: Yes    Types: Marijuana    Home Medications Prior to Admission medications   Medication Sig Start Date End Date Taking? Authorizing Provider  aspirin EC 81 MG tablet Take 81 mg by mouth daily. Swallow whole.    [provider]  cetirizine (ZYRTEC) 10 MG tablet Take 1 tablet (10 mg total) by mouth daily. 05/31/21   Lindell Spar, MD  fluticasone (FLONASE) 50 MCG/ACT nasal spray Place 2 sprays into both nostrils daily. 05/31/21   Lindell Spar, MD  LORazepam (ATIVAN) 0.5 MG tablet Take 1 tablet (0.5 mg total) by mouth 2 (two) times daily as needed. for anxiety 06/02/21   Lindell Spar, MD    Allergies    Patient has no known allergies.  Review of Systems   Review of Systems  Constitutional:  Negative for fever.  Musculoskeletal:  Positive for neck pain.  Neurological:  Positive for tremors and headaches. Negative for syncope.   Physical Exam Updated Vital Signs BP 113/70    Pulse 62    Temp 98.2 F (36.8 C)    Resp 18    SpO2 97%   Physical Exam Vitals and nursing note reviewed. Exam conducted with a chaperone present.  Constitutional:      Appearance: Normal appearance.  HENT:  Head: Normocephalic and atraumatic.  Eyes:     General: No scleral icterus.       Right eye: No discharge.        Left eye: No discharge.     Extraocular Movements: Extraocular movements intact.     Pupils: Pupils are equal, round, and reactive to light.  Cardiovascular:     Rate and Rhythm: Normal rate and regular rhythm.     Pulses: Normal pulses.     Heart sounds: Normal heart sounds. No murmur heard.   No friction rub. No gallop.  Pulmonary:     Effort: Pulmonary effort is normal. No respiratory distress.     Breath sounds: Normal breath sounds.  Abdominal:     General: Abdomen is flat. Bowel sounds are normal. There is no distension.     Palpations: Abdomen is soft.     Tenderness: There is no abdominal tenderness.  Skin:     General: Skin is warm and dry.     Coloration: Skin is not jaundiced.     Comments: Puncture wound from lumbar puncture noted around L4, no surrounding erythema.  Incision site is dry, no fluctuance or discharge  Neurological:     Mental Status: He is alert. Mental status is at baseline.     Coordination: Coordination normal.     Comments: Cranial nerves III through XII are grossly intact.  Grip strength is equal bilaterally, able to raise both lower extremities without difficulty.   ED Results / Procedures / Treatments   Labs (all labs ordered are listed, but only abnormal results are displayed) Labs Reviewed - No data to display  EKG None  Radiology No results found.  Procedures Procedures   Medications Ordered in ED Medications - No data to display  ED Course  I have reviewed the triage vital signs and the nursing notes.  Pertinent labs & imaging results that were available during my care of the patient were reviewed by me and considered in my medical decision making (see chart for details).    MDM Rules/Calculators/A&P                         Stable vitals, patient is nontoxic-appearing.  No signs of underlying infection or cellulitis.  No focal deficits on neuro exam, and spasms appear to be controlled on his Ativan.  Suspect his headache and neck strain is secondary from the lumbar puncture, this is day 3 such a little bit prolonged for postop headache.  Will give fluids and Toradol and reassess.  Patient feels improved after Toradol and fluids.  He is ambulatory without recurrence of symptoms.  Do not think he needs additional work-up at this time, discharged in stable condition.     Final Clinical Impression(s) / ED Diagnoses Final diagnoses:  None    Rx / DC Orders ED Discharge Orders     None        Sherrill Raring, Hershal Coria 08/04/21 1637    Isla Pence, MD 08/05/21 [phone]

## 2021-08-08 ENCOUNTER — Telehealth: Payer: Self-pay | Admitting: Neurology

## 2021-08-08 DIAGNOSIS — R519 Headache, unspecified: Secondary | ICD-10-CM

## 2021-08-08 DIAGNOSIS — G970 Cerebrospinal fluid leak from spinal puncture: Secondary | ICD-10-CM

## 2021-08-08 NOTE — Telephone Encounter (Signed)
Pt called an informed that we ordered blood patch through [facility], for post-spinal tap headache. And that [facility] should call he to get that scheduled

## 2021-08-08 NOTE — Telephone Encounter (Signed)
Pt called in stating he is experiencing some major side effects from his spinal tap he had done on 08/01/21. He has been having headache, muscle pain, neck and shoulder pain, cannot stand up more than a couple of hours before he has to lay down, and partial deafness in his right ear, and tendonitis. He went to the ED on 08/04/21 and was told to get in touch with Dr. Delice Lesch.

## 2021-08-08 NOTE — Addendum Note (Signed)
Addended by: Jake Seats on: 08/08/2021 10:38 AM   Modules accepted: Orders

## 2021-08-08 NOTE — Telephone Encounter (Signed)
Pls get him set up for a blood patch through [facility], for post-spinal tap headache. Thanks

## 2021-08-09 ENCOUNTER — Other Ambulatory Visit: Payer: Self-pay

## 2021-08-09 ENCOUNTER — Ambulatory Visit
Admission: RE | Admit: 2021-08-09 | Discharge: 2021-08-09 | Disposition: A | Discharge status: Home / Self Care | Payer: Medicaid Other | Source: Ambulatory Visit | Attending: Neurology | Admitting: Neurology

## 2021-08-09 DIAGNOSIS — R519 Headache, unspecified: Secondary | ICD-10-CM

## 2021-08-09 DIAGNOSIS — G970 Cerebrospinal fluid leak from spinal puncture: Secondary | ICD-10-CM

## 2021-08-09 LAB — CNS IGG SYNTHESIS RATE, CSF+BLOOD
Albumin Serum: 5.1 g/dL (ref 3.5–5.2)
Albumin, CSF: 12.4 mg/dL (ref 8.0–42.0)
CNS-IgG Synthesis Rate: 18.3 mg/24 h — ABNORMAL HIGH (ref <=3.3)
IgG (Immunoglobin G), Serum: 1290 mg/dL (ref 600–1640)
IgG Total CSF: 6.1 mg/dL (ref 0.8–7.7)
IgG-Index: 1.94 — ABNORMAL HIGH (ref <0.66)

## 2021-08-09 LAB — GLUCOSE, CSF: Glucose, CSF: 57 mg/dL (ref 40–80)

## 2021-08-09 LAB — CSF CELL COUNT WITH DIFFERENTIAL
Basophils, %: 0 %
Eosinophils, CSF: 0 %
Lymphs, CSF: 93 % — ABNORMAL HIGH (ref 40–80)
Monocyte/Macrophage: 7 % — ABNORMAL LOW (ref 15–45)
RBC Count, CSF: 8 cells/uL — ABNORMAL HIGH
Segmented Neutrophils-CSF: 0 % (ref 0–6)
WBC, CSF: 7 cells/uL — ABNORMAL HIGH (ref 0–5)

## 2021-08-09 LAB — CSF CULTURE W GRAM STAIN
GRAM STAIN:: NONE SEEN
MICRO NUMBER:: 12779821
Result:: NO GROWTH
SPECIMEN QUALITY:: ADEQUATE

## 2021-08-09 LAB — OLIGOCLONAL BANDS, CSF + SERM

## 2021-08-09 LAB — PROTEIN, CSF: Total Protein, CSF: 32 mg/dL (ref 15–45)

## 2021-08-09 LAB — ANGIOTENSIN CONVERTING ENZYME, CSF: ANGIOTENSIN CONVERTING ENZYME ( ACE) CSF: 3 U/L (ref <=15)

## 2021-08-09 MED ORDER — IOPAMIDOL (ISOVUE-M 200) INJECTION 41%
1.0000 mL | Freq: Once | INTRAMUSCULAR | Status: AC
  Administered 2021-08-09: 14:00:00 1 mL via EPIDURAL

## 2021-08-09 NOTE — Discharge Instructions (Signed)

## 2021-08-09 NOTE — Progress Notes (Signed)
20 cc of blood drawn from pts RAC to be used for blood patch procedure. 1 successful attempt. Pt tolerated well. Gauze and tape applied after.

## 2021-08-18 ENCOUNTER — Other Ambulatory Visit: Payer: Self-pay

## 2021-08-18 ENCOUNTER — Ambulatory Visit
Admission: RE | Admit: 2021-08-18 | Discharge: 2021-08-18 | Disposition: A | Discharge status: Home / Self Care | Payer: Medicaid Other | Source: Ambulatory Visit | Attending: Neurology | Admitting: Neurology

## 2021-08-18 DIAGNOSIS — G35 Multiple sclerosis: Secondary | ICD-10-CM

## 2021-08-18 DIAGNOSIS — R251 Tremor, unspecified: Secondary | ICD-10-CM

## 2021-08-18 MED ORDER — GADOBENATE DIMEGLUMINE 529 MG/ML IV SOLN
19.0000 mL | Freq: Once | INTRAVENOUS | Status: AC | PRN
  Administered 2021-08-18: 19 mL via INTRAVENOUS

## 2021-08-21 ENCOUNTER — Other Ambulatory Visit: Payer: Self-pay

## 2021-08-21 ENCOUNTER — Encounter: Payer: Self-pay | Admitting: Neurology

## 2021-08-21 ENCOUNTER — Ambulatory Visit: Payer: Medicaid Other | Admitting: Neurology

## 2021-08-21 VITALS — BP 123/70 | HR 63 | Resp 18 | Ht 74.0 in | Wt 197.0 lb

## 2021-08-21 DIAGNOSIS — G971 Other reaction to spinal and lumbar puncture: Secondary | ICD-10-CM | POA: Diagnosis not present

## 2021-08-21 DIAGNOSIS — R9082 White matter disease, unspecified: Secondary | ICD-10-CM

## 2021-08-21 DIAGNOSIS — R251 Tremor, unspecified: Secondary | ICD-10-CM | POA: Diagnosis not present

## 2021-08-21 MED ORDER — BUTALBITAL-APAP-CAFFEINE 50-325-40 MG PO TABS: ORAL_TABLET | ORAL | 0 refills | Status: DC

## 2021-08-21 NOTE — Progress Notes (Signed)
NEUROLOGY FOLLOW UP OFFICE NOTE  Benjamin Waters [id number] January 18, 1976  HISTORY OF PRESENT ILLNESS: I had the pleasure of seeing Benjamin Waters in follow-up in the neurology clinic on 08/21/2021. He is accompanied by his girlfriend Benjamin Waters who helps supplement the history today. The patient was last seen 3 months ago for episodes of upper body stiffening with low amplitude shaking. He had a normal wake and sleep EEG in 06/2021. I personally reviewed MRI brain without contrast done 06/2021 which did not show any acute changes. There were multiple white matter changes seen in the periventricular, deep, and juxtacortical regions with a dominant 1.9cm lesion involving the periventricular white matter at the posterior left frontal region. Few of the foci radiate from the lateral ventricles in a perpendicular fashion, with few corresponding T1 black holes. I discussed the MRI with him and we proceeded with a lumbar puncture and cervical/thoracic MRI. There were no spinal lesions seen. His CSF showed a  WBC of 7 with 93 lymphs, normal protein 32 and glucose 57. Culture was negative, no malignant cells, ACE 3. 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 CNS IgG synthesis elevated 18.3, IgG index elevated 1.94.   He had a post-LP headache and had a blood patch on 08/09/21. The head pressure and tinnitus improved after the blood patch, but he is still having headaches mostly when he bends over or moves fast. Yesterday it felt like his eyeballs were going to "pop out of my head." Pain is better when lying down. His equilibrium is off a little, no falls. He had vertigo after the LP but none since. He reports tingling in the tips of his fingers and toes. No bladder dysfunction. There may be some constipation. He started noticing more of the shaking episodes  since the LP, happening more at night, very rarely during the day. He takes Ativan more frequently. He can tell when they are coming and takes an Ativan, easing symptoms so it would not progress. When the tremors have already started, he would take 2 tablets which would normally stop them. Benjamin Waters denies any staring/unresponsive episodes.  He reports painful muscle spasms in different parts of his body, most bothersome are the chest spasms lasting a few minutes to hours. He feels weak in general, no focal weakness. He has been inactive since the spinal tap, trying to increase activity the past 1-2 weeks but he has not been able to do work. He has neck pain from shoulder issues.    History On Initial Assessment 06/05/2021: This is a pleasant 46 year old ambidextrous right hand dominant man with a history of GAD/panic disorder, presenting for evaluation of seizure-like activity. He started having intermittent episodes around 5 years ago, that became progressively worse that they were occurring daily close to 2 years ago. He can sometimes tell when they would start, he would feel weird with a physical sensation that something is wrong with him. Sometimes there is a pressure on the left side of his chest and he feels his heart beating fast or irregularly. He has a wrist BP cuff at home and it would show an irregular rhythm. His upper body would then stiffen/contract with low amplitude shaking. His fists may clench. He has to consciously focus on his breathing and relax his body, which may or may not help. There would be no loss of consciousness  but a few times he felt like he would pass out when he had gotten lightheaded. There was one time a few days ago where he had a weird taste in his mouth like metal and he noticed he was feeling nervous/anxious with sweat on his face. He attributed to the taste to shaving cream in his mouth. He was prescribed Ativan close to 2 years ago which has helped a lot. Episodes are  not occurring daily, he has around 1-2 a month. No nocturnal episodes. He is able to catch it now and take the Ativan, if he does not take it soon enough, he takes an additional dose. In the past 3 weeks, he has taken 10-14 tablets. He notices random muscle twitches on his chest, thumb, or different parts of his body. He injured his left shoulder which also causes some anxiety. He lives with his partner, 63 year old son, and 63yo nephew, and has not been told of any staring/unresponsive episodes. He denies any loss of time. He denies any significant headaches, dizziness, diplopia, dysarthria/dysphagia, bladder dysfunction. He has neck/back pain and constipation.   He reports an incident in 2008 when he woke up with the right side of his mouth paralyzed. He went to Urgent Care where "they thought it was Bell's palsy" and prescribed Prednisone. Over the next 1-2 days, he had progressive weakness on the right arm then right leg where he needed to pull his right leg up or use his left arm to lift the right. He could move his right hand but there was no feeling in it, he could not write with it. It took 3 months to improve, and was normal in 6 months. He still notices a little droop on the right side of his mouth when smiling and coordination on the right hand is not as good, but feels 98% better. He has occasional numbness and tingling in his fingers and toes. No falls. Sleep is good. Mood "depends on the day," he reports being very stressed. 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 Otherwise he had a normal birth and early development.  There is no history of febrile convulsions, CNS infections such as meningitis/encephalitis, significant traumatic brain injuries, neurosurgical procedures.    PAST MEDICAL HISTORY: Past Medical History:  Diagnosis Date   Anxiety     MEDICATIONS: Current Outpatient Medications on File Prior to Visit  Medication Sig Dispense Refill   aspirin EC 81 MG  tablet Take 81 mg by mouth daily. Swallow whole.     cetirizine (ZYRTEC) 10 MG tablet Take 1 tablet (10 mg total) by mouth daily. 30 tablet 3   fluticasone (FLONASE) 50 MCG/ACT nasal spray Place 2 sprays into both nostrils daily. 16 g 6   LORazepam (ATIVAN) 0.5 MG tablet Take 1 tablet (0.5 mg total) by mouth 2 (two) times daily as needed. for anxiety 30 tablet 3   No current facility-administered medications on file prior to visit.    ALLERGIES: No Known Allergies  FAMILY HISTORY: Family History  Problem Relation Age of Onset   Lung cancer Father    Hypertension Sister    Cancer Sister    Drug abuse Sister     SOCIAL HISTORY: Social History   Socioeconomic History   Marital status: Soil scientist    Spouse name: Not on file   Number of children: Not on file   Years of education: Not on file   Highest education level: Not on file  Occupational  History   Not on file  Tobacco Use   Smoking status: Some Days    Types: Cigarettes    Last attempt to quit: 09/09/2015    Years since quitting: 5.9   Smokeless tobacco: Never  Vaping Use   Vaping Use: Never used  Substance and Sexual Activity   Alcohol use: Yes    Alcohol/week: 10.0 standard drinks    Types: 10 Cans of beer per week   Drug use: Yes    Types: Marijuana   Sexual activity: Yes  Other Topics Concern   Not on file  Social History Narrative   [place] Gent[employer] of 11 years.Lives with fiancee.Currently unemployed.Thinking of going into IT.   Right handed    Social Determinants of Health   Financial Resource Strain: Not on file  Food Insecurity: Not on file  Transportation Needs: Not on file  Physical Activity: Not on file  Stress: Not on file  Social Connections: Not on file  Intimate Partner Violence: Not on file     PHYSICAL EXAM: Vitals:   08/21/21 1555  BP: 123/70  Pulse: 63  Resp: 18  SpO2: 97%   General: No acute distress Head:  Normocephalic/atraumatic Skin/Extremities: No rash, no  edema Neurological Exam: alert and awake. No aphasia or dysarthria. Fund of knowledge is appropriate.   Attention and concentration are normal.   Cranial nerves: Pupils equal, round. Extraocular movements intact with no nystagmus. Visual fields full.  No facial asymmetry.  Motor: Bulk and tone normal, muscle strength 5/5 throughout with no pronator drift. Reflexes +1 throughout.  Finger to nose testing intact.  Gait narrow-based and steady, able to tandem walk adequately.  Romberg negative.   IMPRESSION: This is a pleasant 46 yo ambidextrous right hand dominant man with a history of GAD/panic disorder who presented for evaluation of upper body stiffening/shaking with no loss of consciousness. EEG normal. MRI brain showed white matter changes concerning for demyelinating disease, etiology unclear. MRI cervical/thoracic spine normal. Spinal tap showed >5 OCB in both CSF and serum. We discussed doing bloodwork for MS mimics, check ESR, CRP, ANA, SS-A, SS-B, ANCA, B12, copper, HIV. Discussed that his shaking episodes would not be caused by the changes on MRI, they may still be carpopedal spasms associated with hyperventilation/anxiety, we can consider doing prolonged EEG in the future. We will repeat MRI brain with and without contrast next month to assess for interval change. He continues to report post-LP headaches but improved after blood patch, he will try increasing fluids and take prn Fioricet, but may consider repeating blood patch. He will follow-up after repeat MRI next month and knows to call for any changes.    Thank you for allowing me to participate in his care.  Please do not hesitate to call for any questions or concerns.    Ellouise Newer, M.D.   CC: Dr. Posey Pronto

## 2021-08-21 NOTE — Patient Instructions (Addendum)
Good to see you.   Bloodwork for ESR, CRP, ANA, SS-A, SS-B, ANCA, B12, copper, HIV  Schedule repeat MRI brain with and without contrast for February  3. Can take Fioricet as needed for headache, increase fluid and caffeine intake. Let me know if you would like to do another blood patch  4. Follow-up next month after brain MRI, call for any changes

## 2021-08-22 ENCOUNTER — Other Ambulatory Visit (INDEPENDENT_AMBULATORY_CARE_PROVIDER_SITE_OTHER): Payer: Medicaid Other

## 2021-08-22 DIAGNOSIS — R251 Tremor, unspecified: Secondary | ICD-10-CM | POA: Diagnosis not present

## 2021-08-22 DIAGNOSIS — G971 Other reaction to spinal and lumbar puncture: Secondary | ICD-10-CM

## 2021-08-22 DIAGNOSIS — R9082 White matter disease, unspecified: Secondary | ICD-10-CM

## 2021-08-22 LAB — SEDIMENTATION RATE: Sed Rate: 7 mm/hr (ref 0–15)

## 2021-08-22 LAB — VITAMIN B12: Vitamin B-12: 176 pg/mL — ABNORMAL LOW (ref 211–911)

## 2021-08-22 LAB — C-REACTIVE PROTEIN: CRP: <1 mg/dL (ref 0.5–20.0)

## 2021-08-24 LAB — ANCA SCREEN W REFLEX TITER: ANCA Screen: NEGATIVE

## 2021-08-24 LAB — SJOGRENS SYNDROME-A EXTRACTABLE NUCLEAR ANTIBODY: SSA (Ro) (ENA) Antibody, IgG: <1 AI

## 2021-08-24 LAB — SJOGRENS SYNDROME-B EXTRACTABLE NUCLEAR ANTIBODY: SSB (La) (ENA) Antibody, IgG: <1 AI

## 2021-08-24 LAB — ANA: Anti Nuclear Antibody (ANA): NEGATIVE

## 2021-08-24 LAB — HIV ANTIBODY (ROUTINE TESTING W REFLEX): HIV 1&2 Ab, 4th Generation: NONREACTIVE

## 2021-08-24 LAB — CERULOPLASMIN: Ceruloplasmin: 22 mg/dL (ref 18–36)

## 2021-08-29 ENCOUNTER — Ambulatory Visit (INDEPENDENT_AMBULATORY_CARE_PROVIDER_SITE_OTHER): Payer: Medicaid Other | Admitting: Nurse Practitioner

## 2021-08-29 ENCOUNTER — Other Ambulatory Visit: Payer: Self-pay

## 2021-08-29 ENCOUNTER — Ambulatory Visit: Payer: Medicaid Other

## 2021-08-29 ENCOUNTER — Encounter: Payer: Self-pay | Admitting: Nurse Practitioner

## 2021-08-29 DIAGNOSIS — J0191 Acute recurrent sinusitis, unspecified: Secondary | ICD-10-CM | POA: Diagnosis not present

## 2021-08-29 DIAGNOSIS — J309 Allergic rhinitis, unspecified: Secondary | ICD-10-CM | POA: Diagnosis not present

## 2021-08-29 MED ORDER — AMOXICILLIN 875 MG PO TABS: 875.0000 mg | ORAL_TABLET | Freq: Two times a day (BID) | ORAL | 0 refills | Status: AC

## 2021-08-29 MED ORDER — SALINE NASAL SPRAY 0.65 % NA SOLN: 1.0000 | NASAL | 12 refills | Status: AC | PRN

## 2021-08-29 NOTE — Progress Notes (Signed)
Virtual Visit via Telephone Note  I connected withNAME@ on 08/29/21 at 109pm by telephone and verified that I am speaking with the correct person using two identifiers.I spent 8 minutes talking to pt and reviewing his chart   Location: Patient: home Provider: office.    I discussed the limitations, risks, security and privacy concerns of performing an evaluation and management service by telephone and the availability of in person appointments. I also discussed with the patient that there may be a patient responsible charge related to this service. The patient expressed understanding and agreed to proceed.   History of Present Illness: Pt c/ o headache 7/10, sinus pressure when he coughs and bends over, In October he was prescribed zyrtec., he stopped using zyrtec when he got better, he recently restarted Zyrtec but  is not helping. Has yellowish green mucus mixed with blood at times., nostrils are dry most time. Pt denies fever, chills, cp, wheezing , sob., nausea, vomiting.  He has not been using his floanse    Observations/Objective:   Assessment and Plan: Sinusitis Take amoxicillin 875mg  BID for 7 days     Flonase nasal spray daily Zyrtec 10mg  daily Saline nasal spray  PRN  Pt encouraged to use his zyrtec and flonase daily.  Take tylenol as needed for HA and fever.   Follow Up Instructions:    I discussed the assessment and treatment plan with the patient. The patient was provided an opportunity to ask questions and all were answered. The patient agreed with the plan and demonstrated an understanding of the instructions.   The patient was advised to call back or seek an in-person evaluation if the symptoms worsen or if the condition fails to improve as anticipated.

## 2021-08-29 NOTE — Assessment & Plan Note (Signed)
Take amoxicillin 875mg  BID for 7 days     Flonase nasal spray daily Zyrtec 10mg  daily Saline nasal spray  PRN  Pt encouraged to use his zyrtec and flonase daily.  Take tylenol as needed for HA and fever.

## 2021-09-04 ENCOUNTER — Other Ambulatory Visit: Payer: Self-pay

## 2021-09-04 ENCOUNTER — Ambulatory Visit (INDEPENDENT_AMBULATORY_CARE_PROVIDER_SITE_OTHER): Payer: Medicaid Other

## 2021-09-04 DIAGNOSIS — E538 Deficiency of other specified B group vitamins: Secondary | ICD-10-CM | POA: Diagnosis not present

## 2021-09-04 MED ORDER — CYANOCOBALAMIN 1000 MCG/ML IJ SOLN
1000.0000 ug | Freq: Once | INTRAMUSCULAR | Status: AC
  Administered 2021-09-04: 1000 ug via INTRAMUSCULAR

## 2021-09-05 ENCOUNTER — Ambulatory Visit (INDEPENDENT_AMBULATORY_CARE_PROVIDER_SITE_OTHER): Payer: Medicaid Other | Admitting: *Deleted

## 2021-09-05 DIAGNOSIS — E538 Deficiency of other specified B group vitamins: Secondary | ICD-10-CM | POA: Diagnosis not present

## 2021-09-05 MED ORDER — CYANOCOBALAMIN 1000 MCG/ML IJ SOLN
1000.0000 ug | Freq: Once | INTRAMUSCULAR | Status: AC
  Administered 2021-09-05: 09:00:00 1000 ug via INTRAMUSCULAR

## 2021-09-06 ENCOUNTER — Other Ambulatory Visit: Payer: Self-pay

## 2021-09-06 ENCOUNTER — Ambulatory Visit (INDEPENDENT_AMBULATORY_CARE_PROVIDER_SITE_OTHER): Payer: Medicaid Other | Admitting: *Deleted

## 2021-09-06 DIAGNOSIS — E538 Deficiency of other specified B group vitamins: Secondary | ICD-10-CM

## 2021-09-06 MED ORDER — CYANOCOBALAMIN 1000 MCG/ML IJ SOLN
1000.0000 ug | Freq: Once | INTRAMUSCULAR | Status: AC
  Administered 2021-09-06: 11:00:00 1000 ug via INTRAMUSCULAR

## 2021-09-07 ENCOUNTER — Ambulatory Visit (INDEPENDENT_AMBULATORY_CARE_PROVIDER_SITE_OTHER): Payer: Medicaid Other | Admitting: *Deleted

## 2021-09-07 DIAGNOSIS — E538 Deficiency of other specified B group vitamins: Secondary | ICD-10-CM

## 2021-09-07 MED ORDER — CYANOCOBALAMIN 1000 MCG/ML IJ SOLN
1000.0000 ug | Freq: Once | INTRAMUSCULAR | Status: AC
  Administered 2021-09-07: 1000 ug via INTRAMUSCULAR

## 2021-09-08 ENCOUNTER — Other Ambulatory Visit: Payer: Self-pay

## 2021-09-08 ENCOUNTER — Encounter: Payer: Self-pay | Admitting: Neurology

## 2021-09-08 ENCOUNTER — Ambulatory Visit (INDEPENDENT_AMBULATORY_CARE_PROVIDER_SITE_OTHER): Payer: Medicaid Other | Admitting: *Deleted

## 2021-09-08 DIAGNOSIS — E538 Deficiency of other specified B group vitamins: Secondary | ICD-10-CM | POA: Diagnosis not present

## 2021-09-08 MED ORDER — CYANOCOBALAMIN 1000 MCG/ML IJ SOLN
1000.0000 ug | Freq: Once | INTRAMUSCULAR | Status: AC
  Administered 2021-09-08: 1000 ug via INTRAMUSCULAR

## 2021-09-11 ENCOUNTER — Ambulatory Visit: Payer: Medicaid Other

## 2021-09-11 ENCOUNTER — Other Ambulatory Visit: Payer: Self-pay

## 2021-09-11 DIAGNOSIS — G971 Other reaction to spinal and lumbar puncture: Secondary | ICD-10-CM

## 2021-09-12 ENCOUNTER — Other Ambulatory Visit: Payer: Self-pay | Admitting: Internal Medicine

## 2021-09-12 ENCOUNTER — Encounter: Payer: Self-pay | Admitting: Internal Medicine

## 2021-09-12 ENCOUNTER — Telehealth: Payer: Self-pay

## 2021-09-12 DIAGNOSIS — J329 Chronic sinusitis, unspecified: Secondary | ICD-10-CM

## 2021-09-12 DIAGNOSIS — H6983 Other specified disorders of Eustachian tube, bilateral: Secondary | ICD-10-CM

## 2021-09-12 NOTE — Telephone Encounter (Signed)
Pt called he stated that he read the messages and has the number to [facility] but her really does not want another needle in his back if he doesn't have to. Pt advised that Dr Delice Lesch wants him to call PCP to see if they want to see him or if they want to send him to ENT for his sinus and congestion pt verbalized understanding and stated he would call them

## 2021-09-13 ENCOUNTER — Ambulatory Visit (INDEPENDENT_AMBULATORY_CARE_PROVIDER_SITE_OTHER): Payer: Medicaid Other | Admitting: *Deleted

## 2021-09-13 ENCOUNTER — Other Ambulatory Visit: Payer: Self-pay

## 2021-09-13 DIAGNOSIS — E538 Deficiency of other specified B group vitamins: Secondary | ICD-10-CM | POA: Diagnosis not present

## 2021-09-13 MED ORDER — CYANOCOBALAMIN 1000 MCG/ML IJ SOLN
1000.0000 ug | Freq: Once | INTRAMUSCULAR | Status: AC
  Administered 2021-09-13: 1000 ug via INTRAMUSCULAR

## 2021-09-20 ENCOUNTER — Ambulatory Visit (INDEPENDENT_AMBULATORY_CARE_PROVIDER_SITE_OTHER): Payer: Medicaid Other

## 2021-09-20 ENCOUNTER — Other Ambulatory Visit: Payer: Self-pay

## 2021-09-20 DIAGNOSIS — E538 Deficiency of other specified B group vitamins: Secondary | ICD-10-CM

## 2021-09-20 MED ORDER — CYANOCOBALAMIN 1000 MCG/ML IJ SOLN
1000.0000 ug | Freq: Once | INTRAMUSCULAR | Status: AC
  Administered 2021-09-20: 1000 ug via INTRAMUSCULAR

## 2021-09-27 ENCOUNTER — Other Ambulatory Visit: Payer: Self-pay

## 2021-09-27 ENCOUNTER — Ambulatory Visit (INDEPENDENT_AMBULATORY_CARE_PROVIDER_SITE_OTHER): Payer: Medicaid Other | Admitting: *Deleted

## 2021-09-27 DIAGNOSIS — E538 Deficiency of other specified B group vitamins: Secondary | ICD-10-CM

## 2021-09-27 MED ORDER — CYANOCOBALAMIN 1000 MCG/ML IJ SOLN
1000.0000 ug | Freq: Once | INTRAMUSCULAR | Status: AC
  Administered 2021-09-27: 1000 ug via INTRAMUSCULAR

## 2021-10-04 ENCOUNTER — Other Ambulatory Visit: Payer: Self-pay

## 2021-10-04 ENCOUNTER — Ambulatory Visit (INDEPENDENT_AMBULATORY_CARE_PROVIDER_SITE_OTHER): Payer: Medicaid Other

## 2021-10-04 DIAGNOSIS — E538 Deficiency of other specified B group vitamins: Secondary | ICD-10-CM | POA: Diagnosis not present

## 2021-10-04 MED ORDER — CYANOCOBALAMIN 1000 MCG/ML IJ SOLN
1000.0000 ug | Freq: Once | INTRAMUSCULAR | Status: AC
  Administered 2021-10-04: 1000 ug via INTRAMUSCULAR

## 2021-10-05 ENCOUNTER — Encounter: Payer: Self-pay | Admitting: Neurology

## 2021-10-08 ENCOUNTER — Other Ambulatory Visit: Payer: Self-pay | Admitting: Internal Medicine

## 2021-10-08 DIAGNOSIS — F411 Generalized anxiety disorder: Secondary | ICD-10-CM

## 2021-10-09 ENCOUNTER — Ambulatory Visit
Admission: RE | Admit: 2021-10-09 | Discharge: 2021-10-09 | Disposition: A | Discharge status: Home / Self Care | Payer: Medicaid Other | Source: Ambulatory Visit | Attending: Neurology | Admitting: Neurology

## 2021-10-09 DIAGNOSIS — R251 Tremor, unspecified: Secondary | ICD-10-CM

## 2021-10-09 DIAGNOSIS — G971 Other reaction to spinal and lumbar puncture: Secondary | ICD-10-CM

## 2021-10-09 DIAGNOSIS — R9082 White matter disease, unspecified: Secondary | ICD-10-CM

## 2021-10-09 MED ORDER — GADOBENATE DIMEGLUMINE 529 MG/ML IV SOLN
19.0000 mL | Freq: Once | INTRAVENOUS | Status: AC | PRN
  Administered 2021-10-09: 19 mL via INTRAVENOUS

## 2021-10-11 ENCOUNTER — Encounter: Payer: Self-pay | Admitting: Neurology

## 2021-10-11 ENCOUNTER — Other Ambulatory Visit: Payer: Self-pay

## 2021-10-11 ENCOUNTER — Ambulatory Visit: Payer: Medicaid Other | Admitting: Neurology

## 2021-10-11 VITALS — BP 130/88 | HR 92 | Ht 74.0 in | Wt 201.6 lb

## 2021-10-11 DIAGNOSIS — G4484 Primary exertional headache: Secondary | ICD-10-CM | POA: Diagnosis not present

## 2021-10-11 DIAGNOSIS — G35D Multiple sclerosis, unspecified: Secondary | ICD-10-CM

## 2021-10-11 DIAGNOSIS — G35 Multiple sclerosis: Secondary | ICD-10-CM | POA: Diagnosis not present

## 2021-10-11 DIAGNOSIS — R519 Headache, unspecified: Secondary | ICD-10-CM

## 2021-10-11 MED ORDER — INDOMETHACIN 25 MG PO CAPS: ORAL_CAPSULE | ORAL | 11 refills | Status: DC

## 2021-10-11 NOTE — Progress Notes (Unsigned)
NEUROLOGY FOLLOW UP OFFICE NOTE  Benjamin Waters [id number] Aug 22, 1975  HISTORY OF PRESENT ILLNESS: I had the pleasure of seeing *** in follow-up in the neurology clinic on ***.  The patient was last seen on *** and is accompanied by *** today.  Records and images were personally reviewed where available.  ***.  Everyday at random, unilateral, usually on the right but sometimes on left Stood up to get pizza and had extreme HA and starts from back and then whole head; goes away fairly soon, extremely painful B12 injections started 09/2021 Tinnitus mainly in right ear, switched one day to left but most part right ear Neck pain sometimes, sleep 6hrs, having hard time staying comfortable, shoulder is messed up (neck from posture) Has been taking it easy 1-2 mins positional; the other headaches mainly the right side (random on a daily basis) Numbness/tingling in hands      still having positional headaches like when I bend over, stand up too fast or exert myself.  The most concerning are the ones that I get when I stand up too fast as they are excruciatingly painful. Bending over also causes headaches but usually only if I'm picking up something. Any prolonged physical activity (trimming hedges, running, etc.) will cause headaches which has limited my ability to work. Lying down helps. Also still experiencing mild tinnitus.    Overall I seem to be getting better over time but Donnald Garre never had a history of frequent headaches until the lumbar puncture was performed. Even with my sinus issues I never really had many headaches.    Also what do you think is the underlying cause of my b12 deficiency? After research I see that b12 deficiency closely resembles many of the symptoms of MS, but that MS can also be the cause of b12 deficiency.   I had the pleasure of seeing Benjamin Waters in follow-up in the neurology clinic on 08/21/2021. He is accompanied by his girlfriend [name] who helps supplement the  history today. The patient was last seen 3 months ago for episodes of upper body stiffening with low amplitude shaking. He had a normal wake and sleep EEG in 06/2021. I personally reviewed MRI brain without contrast done 06/2021 which did not show any acute changes. There were multiple white matter changes seen in the periventricular, deep, and juxtacortical regions with a dominant 1.9cm lesion involving the periventricular white matter at the posterior left frontal region. Few of the foci radiate from the lateral ventricles in a perpendicular fashion, with few corresponding T1 black holes. I discussed the MRI with him and we proceeded with a lumbar puncture and cervical/thoracic MRI. There were no spinal lesions seen. His CSF showed a  WBC of 7 with 93 lymphs, normal protein 32 and glucose 57. Culture was negative, no malignant cells, ACE 3. 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 CNS IgG synthesis elevated 18.3, IgG index elevated 1.94.   He had a post-LP headache and had a blood patch on 08/09/21. The head pressure and tinnitus improved after the blood patch, but he is still having headaches mostly when he bends over or moves fast. Yesterday it felt like his eyeballs were going to "pop out of my head." Pain is better when lying down. His equilibrium is off a little, no falls. He had vertigo after the LP but none since. He  reports tingling in the tips of his fingers and toes. No bladder dysfunction. There may be some constipation. He started noticing more of the shaking episodes since the LP, happening more at night, very rarely during the day. He takes Ativan more frequently. He can tell when they are coming and takes an Ativan, easing symptoms so it would not progress. When the tremors have already started, he would take 2 tablets which would  normally stop them. [name] denies any staring/unresponsive episodes.  He reports painful muscle spasms in different parts of his body, most bothersome are the chest spasms lasting a few minutes to hours. He feels weak in general, no focal weakness. He has been inactive since the spinal tap, trying to increase activity the past 1-2 weeks but he has not been able to do work. He has neck pain from shoulder issues.    History On Initial Assessment 06/05/2021: This is a pleasant 46 year old ambidextrous right hand dominant man with a history of GAD/panic disorder, presenting for evaluation of seizure-like activity. He started having intermittent episodes around 5 years ago, that became progressively worse that they were occurring daily close to 2 years ago. He can sometimes tell when they would start, he would feel weird with a physical sensation that something is wrong with him. Sometimes there is a pressure on the left side of his chest and he feels his heart beating fast or irregularly. He has a wrist BP cuff at home and it would show an irregular rhythm. His upper body would then stiffen/contract with low amplitude shaking. His fists may clench. He has to consciously focus on his breathing and relax his body, which may or may not help. There would be no loss of consciousness but a few times he felt like he would pass out when he had gotten lightheaded. There was one time a few days ago where he had a weird taste in his mouth like metal and he noticed he was feeling nervous/anxious with sweat on his face. He attributed to the taste to shaving cream in his mouth. He was prescribed Ativan close to 2 years ago which has helped a lot. Episodes are not occurring daily, he has around 1-2 a month. No nocturnal episodes. He is able to catch it now and take the Ativan, if he does not take it soon enough, he takes an additional dose. In the past 3 weeks, he has taken 10-14 tablets. He notices random muscle twitches on his  chest, thumb, or different parts of his body. He injured his left shoulder which also causes some anxiety. He lives with his partner, 95 year old son, and 58yo nephew, and has not been told of any staring/unresponsive episodes. He denies any loss of time. He denies any significant headaches, dizziness, diplopia, dysarthria/dysphagia, bladder dysfunction. He has neck/back pain and constipation.   He reports an incident in 2008 when he woke up with the right side of his mouth paralyzed. He went to Urgent Care where "they thought it was Bell's palsy" and prescribed Prednisone. Over the next 1-2 days, he had progressive weakness on the right arm then right leg where he needed to pull his right leg up or use his left arm to lift the right. He could move his right hand but there was no feeling in it, he could not write with it. It took 3 months to improve, and was normal in 6 months. He still notices a little droop on the right side of his  mouth when smiling and coordination on the right hand is not as good, but feels 98% better. He has occasional numbness and tingling in his fingers and toes. No falls. Sleep is good. Mood "depends on the day," he reports being very stressed. 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 Otherwise he had a normal birth and early development.  There is no history of febrile convulsions, CNS infections such as meningitis/encephalitis, significant traumatic brain injuries, neurosurgical procedures.    PAST MEDICAL HISTORY: Past Medical History:  Diagnosis Date   Anxiety     MEDICATIONS: Current Outpatient Medications on File Prior to Visit  Medication Sig Dispense Refill   butalbital-acetaminophen-caffeine (FIORICET) 50-325-40 MG tablet Take 1 tablet three times a day as needed for headache. 30 tablet 0   cetirizine (ZYRTEC) 10 MG tablet Take 1 tablet (10 mg total) by mouth daily. 30 tablet 3   fluticasone (FLONASE) 50 MCG/ACT nasal spray Place 2 sprays into  both nostrils daily. 16 g 6   LORazepam (ATIVAN) 0.5 MG tablet Take 1 tablet by mouth twice daily as needed for anxiety 30 tablet 2   sodium chloride (OCEAN) 0.65 % nasal spray Place 1 spray into the nose as needed for congestion. 30 mL 12   aspirin EC 81 MG tablet Take 81 mg by mouth daily. Swallow whole. (Patient not taking: Reported on 10/11/2021)     No current facility-administered medications on file prior to visit.    ALLERGIES: No Known Allergies  FAMILY HISTORY: Family History  Problem Relation Age of Onset   Lung cancer Father    Hypertension Sister    Cancer Sister    Drug abuse Sister     SOCIAL HISTORY: Social History   Socioeconomic History   Marital status: Soil scientist    Spouse name: Not on file   Number of children: Not on file   Years of education: Not on file   Highest education level: Not on file  Occupational History   Not on file  Tobacco Use   Smoking status: Some Days    Types: Cigarettes    Last attempt to quit: 09/09/2015    Years since quitting: 6.0   Smokeless tobacco: Never  Vaping Use   Vaping Use: Never used  Substance and Sexual Activity   Alcohol use: Yes    Alcohol/week: 10.0 standard drinks    Types: 10 Cans of beer per week   Drug use: Yes    Types: Marijuana   Sexual activity: Yes  Other Topics Concern   Not on file  Social History Narrative   [employer] of 11 years.Lives with fiancee.Currently unemployed.Thinking of going into IT.   Right handed    Social Determinants of Health   Financial Resource Strain: Not on file  Food Insecurity: Not on file  Transportation Needs: Not on file  Physical Activity: Not on file  Stress: Not on file  Social Connections: Not on file  Intimate Partner Violence: Not on file     PHYSICAL EXAM: Vitals:   10/11/21 1417  BP: 130/88  Pulse: 92  SpO2: 97%   General: No acute distress Head:  Normocephalic/atraumatic Skin/Extremities: No rash, no edema Neurological Exam: alert and  oriented to person, place, and time. No aphasia or dysarthria. Fund of knowledge is appropriate.  Recent and remote memory are intact.  Attention and concentration are normal.   Cranial nerves: Pupils equal, round. Extraocular movements intact with no nystagmus. Visual fields full.  No  facial asymmetry.  Motor: Bulk and tone normal, muscle strength 5/5 throughout with no pronator drift.   Finger to nose testing intact.  Gait narrow-based and steady, able to tandem walk adequately.  Romberg negative.   IMPRESSION: This is a pleasant 46 yo ambidextrous right hand dominant man with a history of GAD/panic disorder who presented for evaluation of upper body stiffening/shaking with no loss of consciousness. EEG normal. MRI brain showed white matter changes concerning for demyelinating disease, etiology unclear. MRI cervical/thoracic spine normal. Spinal tap showed >5 OCB in both CSF and serum. We discussed doing bloodwork for MS mimics, check ESR, CRP, ANA, SS-A, SS-B, ANCA, B12, copper, HIV. Discussed that his shaking episodes would not be caused by the changes on MRI, they may still be carpopedal spasms associated with hyperventilation/anxiety, we can consider doing prolonged EEG in the future. We will repeat MRI brain with and without contrast next month to assess for interval change. He continues to report post-LP headaches but improved after blood patch, he will try increasing fluids and take prn Fioricet, but may consider repeating blood patch. He will follow-up after repeat MRI next month and knows to call for any changes.      Thank you for allowing me to participate in *** care.  Please do not hesitate to call for any questions or concerns.  The duration of this appointment visit was *** minutes of face-to-face time with the patient.  Greater than 50% of this time was spent in counseling, explanation of diagnosis, planning of further management, and coordination of care.   Ellouise Newer,  M.D.   CC: ***

## 2021-10-11 NOTE — Patient Instructions (Signed)
Try the Indomethacin 25mg  tablet 30 minutes prior to strenuous activity ? ?2. The options for headache preventative medications include Gabapentin, Nortriptyline, or Amitriptyline ? ?3. The medication for MS I would recommend we start is Vumerity. We will check bloodwork before starting medication ? ?4. Follow-up in 1 month, call for any changes ?

## 2021-10-24 ENCOUNTER — Encounter: Payer: Self-pay | Admitting: Neurology

## 2021-11-13 ENCOUNTER — Ambulatory Visit: Payer: Medicaid Other | Admitting: Neurology

## 2021-11-13 ENCOUNTER — Encounter: Payer: Self-pay | Admitting: Neurology

## 2021-11-13 VITALS — BP 119/79 | HR 69 | Ht 74.0 in | Wt 202.8 lb

## 2021-11-13 DIAGNOSIS — R251 Tremor, unspecified: Secondary | ICD-10-CM

## 2021-11-13 DIAGNOSIS — R519 Headache, unspecified: Secondary | ICD-10-CM | POA: Diagnosis not present

## 2021-11-13 DIAGNOSIS — G35 Multiple sclerosis: Secondary | ICD-10-CM

## 2021-11-13 MED ORDER — AMITRIPTYLINE HCL 25 MG PO TABS: ORAL_TABLET | ORAL | 6 refills | Status: DC

## 2021-11-13 NOTE — Progress Notes (Signed)
? ?NEUROLOGY FOLLOW UP OFFICE NOTE ? ?Benjamin Waters ?[id number] ?1976-03-10 ? ?HISTORY OF PRESENT ILLNESS: ?I had the pleasure of seeing Benjamin Waters in follow-up in the neurology clinic on 11/13/2021.  The patient was last seen a month ago. He was initially seen for episodes of upper body stiffening with low amplitude shaking. EEG was normal in 06/2020. As part of his workup, he had a brain MRI which showed changes concerning for demyelinating disease. Repeat brain MRI with and without contrast done 10/10/2021 did not show any enhancing lesions seen, no changes in the white matter changes seen bilaterally,in the juxtacortical regions, and largest in the left periventricular region. There were no spinal cord lesions on cervical/thoracic MRI. LP showed  >5 well defined gamma restriction bands in CSF with additional identical gamma restriction bands in the CSF and serum, indicative of intra-cerebral as well as systemic synthesis of gammaglobulins. CNS IgG synthesis elevated 18.3, IgG index elevated 1.94.  ? ?On his last visit, main concern were headaches that continued since his LP in December 2022 s/p blood patch 08/09/21. Headaches were mostly exertional and positional with mild tinnitus. We discussed a trial of indomethacin for exertional headaches. He has taken the Indomethacin twice prior to exertion and did not have a headache. He feels the headaches are not as bad, he is still having them but not as bad as they were. They are still a combination of positional and exertional headaches, instead of having a headache each time he bends down, it occurs half the time. His left shoulder is bothering him as much as the headaches with 9-10/10 pain on and off throughout the day, worse with movements with limited abduction. He has been fatigued for a long time. He initially presented for upper body shaking/stiffening, EEG normal. These quieted down for a couple of months, but he had a small episode last night. He could  tell it was coming on and took an Ativan which helped stop it. He feels they have a lot to do with his stress level, there is a lot of stress currently. He takes 1-2 tablets every 4-5 days, he would feel like he has nervous energy, jittery, "neurotic in my mood," which is an indicator he is about to have another episode. No recent falls. ? ? ? ?History On Initial Assessment 06/05/2021: This is a pleasant 46 year old ambidextrous right hand dominant man with a history of GAD/panic disorder, presenting for evaluation of seizure-like activity. He started having intermittent episodes around 5 years ago, that became progressively worse that they were occurring daily close to 2 years ago. He can sometimes tell when they would start, he would feel weird with a physical sensation that something is wrong with him. Sometimes there is a pressure on the left side of his chest and he feels his heart beating fast or irregularly. He has a wrist BP cuff at home and it would show an irregular rhythm. His upper body would then stiffen/contract with low amplitude shaking. His fists may clench. He has to consciously focus on his breathing and relax his body, which may or may not help. There would be no loss of consciousness but a few times he felt like he would pass out when he had gotten lightheaded. There was one time a few days ago where he had a weird taste in his mouth like metal and he noticed he was feeling nervous/anxious with sweat on his face. He attributed to the taste to shaving cream in his mouth.  He was prescribed Ativan close to 2 years ago which has helped a lot. Episodes are not occurring daily, he has around 1-2 a month. No nocturnal episodes. He is able to catch it now and take the Ativan, if he does not take it soon enough, he takes an additional dose. In the past 3 weeks, he has taken 10-14 tablets. He notices random muscle twitches on his chest, thumb, or different parts of his body. He injured his left shoulder  which also causes some anxiety. He lives with his partner, 53 year old son, and 80yo nephew, and has not been told of any staring/unresponsive episodes. He denies any loss of time. He denies any significant headaches, dizziness, diplopia, dysarthria/dysphagia, bladder dysfunction. He has neck/back pain and constipation.  ? ?He reports an incident in 2008 when he woke up with the right side of his mouth paralyzed. He went to Urgent Care where "they thought it was Bell's palsy" and prescribed Prednisone. Over the next 1-2 days, he had progressive weakness on the right arm then right leg where he needed to pull his right leg up or use his left arm to lift the right. He could move his right hand but there was no feeling in it, he could not write with it. It took 3 months to improve, and was normal in 6 months. He still notices a little droop on the right side of his mouth when smiling and coordination on the right hand is not as good, but feels 98% better. He has occasional numbness and tingling in his fingers and toes. No falls. Sleep is good. Mood "depends on the day," he reports being very stressed. 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 Otherwise he had a normal birth and early development.  There is no history of febrile convulsions, CNS infections such as meningitis/encephalitis, significant traumatic brain injuries, neurosurgical procedures.  ? ?Diagnostic Data: ?MRI brain without contrast done 06/2021 did not show any acute changes. There were multiple white matter changes seen in the periventricular, deep, and juxtacortical regions with a dominant 1.9cm lesion involving the periventricular white matter at the posterior left frontal region. Few of the foci radiate from the lateral ventricles in a perpendicular fashion, with few corresponding T1 black holes.  ? ?Cervical/thoracic MRI done 09/2021: No spinal lesions seen.  ? ?Lumbar puncture 07/2021: CSF showed a  WBC of 7 with 93 lymphs,  normal protein 32 and glucose 57. Culture was negative, no malignant cells, ACE 3. 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 CNS IgG synthesis elevated 18.3, IgG index elevated 1.94.  ? ?Normal wake and sleep EEG in 06/2021.  ? ?Bloodwork done was negative for autoimmune workup, his B12 level was 176, he has started B12 injections. ? ?PAST MEDICAL HISTORY: ?Past Medical History:  ?Diagnosis Date  ? Anxiety   ? ? ?MEDICATIONS: ?Current Outpatient Medications on File Prior to Visit  ?Medication Sig Dispense Refill  ? aspirin EC 81 MG tablet Take 81 mg by mouth daily. Swallow whole. (Patient not taking: Reported on 10/11/2021)    ? butalbital-acetaminophen-caffeine (FIORICET) 50-325-40 MG tablet Take 1 tablet three times a day as needed for headache. 30 tablet 0  ? cetirizine (ZYRTEC) 10 MG tablet Take 1 tablet (10 mg total) by mouth daily. 30 tablet 3  ? fluticasone (FLONASE) 50 MCG/ACT nasal  spray Place 2 sprays into both nostrils daily. 16 g 6  ? indomethacin (INDOCIN) 25 MG capsule Take 1 tablet as needed prior to strenuous activity 10 capsule 11  ? LORazepam (ATIVAN) 0.5 MG tablet Take 1 tablet by mouth twice daily as needed for anxiety 30 tablet 2  ? sodium chloride (OCEAN) 0.65 % nasal spray Place 1 spray into the nose as needed for congestion. 30 mL 12  ? ?No current facility-administered medications on file prior to visit.  ? ? ?ALLERGIES: ?No Known Allergies ? ?FAMILY HISTORY: ?Family History  ?Problem Relation Age of Onset  ? Lung cancer Father   ? Hypertension Sister   ? Cancer Sister   ? Drug abuse Sister   ? ? ?SOCIAL HISTORY: ?Social History  ? ?Socioeconomic History  ? Marital status: Soil scientist  ?  Spouse name: Not on file  ? Number of children: Not on file  ? Years of education: Not on file  ? Highest education level: Not on file   ?Occupational History  ? Not on file  ?Tobacco Use  ? Smoking status: Some Days  ?  Types: Cigarettes  ?  Last attempt to quit: 09/09/2015  ?  Years since quitting: 6.1  ? Smokeless tobacco: Never  ?Vaping Use  ?

## 2021-11-13 NOTE — Patient Instructions (Signed)
Good to see you. ? ?Schedule f/u brain MRI with and without contrast for end of August 2023 ? ?2. Start amitriptyline '25mg'$ : take 1/2 tablet every night for 1 week, then increase to 1 tablet every night as tolerated ? ?3. Take Indomethacin as needed ? ?4. Check with your insurance regarding formulary coverage for MS ? ?5. Follow-up in 4 months (after MRI), call for any changes ?

## 2021-11-29 ENCOUNTER — Encounter: Payer: Self-pay | Admitting: Internal Medicine

## 2021-11-29 ENCOUNTER — Ambulatory Visit (INDEPENDENT_AMBULATORY_CARE_PROVIDER_SITE_OTHER): Payer: Medicaid Other | Admitting: Internal Medicine

## 2021-11-29 VITALS — BP 112/82 | HR 54 | Resp 16 | Ht 74.0 in | Wt 203.4 lb

## 2021-11-29 DIAGNOSIS — G35 Multiple sclerosis: Secondary | ICD-10-CM | POA: Diagnosis not present

## 2021-11-29 DIAGNOSIS — Z1159 Encounter for screening for other viral diseases: Secondary | ICD-10-CM

## 2021-11-29 DIAGNOSIS — G8929 Other chronic pain: Secondary | ICD-10-CM

## 2021-11-29 DIAGNOSIS — F411 Generalized anxiety disorder: Secondary | ICD-10-CM

## 2021-11-29 DIAGNOSIS — M25512 Pain in left shoulder: Secondary | ICD-10-CM | POA: Diagnosis not present

## 2021-11-29 DIAGNOSIS — Z0000 Encounter for general adult medical examination without abnormal findings: Secondary | ICD-10-CM

## 2021-11-29 DIAGNOSIS — E538 Deficiency of other specified B group vitamins: Secondary | ICD-10-CM | POA: Diagnosis not present

## 2021-11-29 DIAGNOSIS — M26629 Arthralgia of temporomandibular joint, unspecified side: Secondary | ICD-10-CM | POA: Insufficient documentation

## 2021-11-29 DIAGNOSIS — R519 Headache, unspecified: Secondary | ICD-10-CM | POA: Insufficient documentation

## 2021-11-29 MED ORDER — CYANOCOBALAMIN 1000 MCG/ML IJ SOLN
1000.0000 ug | Freq: Once | INTRAMUSCULAR | Status: AC
  Administered 2021-11-29: 1000 ug via INTRAMUSCULAR

## 2021-11-29 MED ORDER — TIZANIDINE HCL 4 MG PO TABS: 4.0000 mg | ORAL_TABLET | Freq: Three times a day (TID) | ORAL | 1 refills | Status: DC | PRN

## 2021-11-29 NOTE — Assessment & Plan Note (Signed)
Recently placed on amitriptyline ?Followed by neurology ?

## 2021-11-29 NOTE — Progress Notes (Signed)
? ?Established Patient Office Visit ? ?Subjective:  ?Patient ID: Ebrima Ranta, male    DOB: 07/04/1976  Age: 46 y.o. MRN: [id number] ? ?CC:  ?Chief Complaint  ?Patient presents with  ? Follow-up  ?  6 month follow up left shoulder and arm hurting injured when younger then fell on it again recently   ? ? ?HPI ?Cecile Guevara is a 46 y.o. male with past medical history of GAD, MS and allergic sinusitis who presents for f/u of his chronic medical conditions. ? ?He comes complains of left shoulder pain, which is chronic, but has been worse recently.  He has worsening of pain over extension and posterior movement.  He reports a remote injury of left shoulder, and also reports recent injury of left arm.  He currently has left upper arm pain, which is intermittent, sharp, radiating to LUE towards the elbow area.  He states that he has twitching of his face when he has severe left arm pain. ? ?He has history of panic episodes, but does not recall any triggers.  He denies any dyspnea during the episode, but feels enclosed in a space, and has headache, chest pain, diffuse muscle aches and blurry vision. He also reports clenching of his hands and jerking of UE. ? ?He has seen neurologist for intermittent jerking movements, numbness and weakness of the facial area and weakness of RUE and RLE.  He has been diagnosed with MS, but he prefers not to start any treatment for now.  He has started taking amitriptyline for chronic headache. ? ?Past Medical History:  ?Diagnosis Date  ? Anxiety   ? 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 ? ?History reviewed. No pertinent surgical history. ? ?Family History  ?Problem Relation Age of Onset  ? Lung cancer Father   ? Hypertension Sister   ? Cancer Sister   ? Drug abuse Sister   ? ? ?Social History  ? ?Socioeconomic History  ? Marital status: Soil scientist  ?  Spouse name: Not on file  ? Number of children: Not on file  ? Years of education: Not on file  ? Highest education level: Not  on file  ?Occupational History  ? Not on file  ?Tobacco Use  ? Smoking status: Some Days  ?  Types: Cigarettes  ?  Last attempt to quit: 09/09/2015  ?  Years since quitting: 6.2  ? Smokeless tobacco: Never  ?Vaping Use  ? Vaping Use: Never used  ?Substance and Sexual Activity  ? Alcohol use: Yes  ?  Alcohol/week: 10.0 standard drinks  ?  Types: 10 Cans of beer per week  ? Drug use: Not Currently  ?  Types: Marijuana  ? Sexual activity: Yes  ?Other Topics Concern  ? Not on file  ?Social History Narrative  ? 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  ? Right handed   ? ?Social Determinants of Health  ? ?Financial Resource Strain: Not on file  ?Food Insecurity: Not on file  ?Transportation Needs: Not on file  ?Physical Activity: Not on file  ?Stress: Not on file  ?Social Connections: Not on file  ?Intimate Partner Violence: Not on file  ? ? ?Outpatient Medications Prior to Visit  ?Medication Sig Dispense Refill  ? amitriptyline (ELAVIL) 25 MG tablet Take 1/2 tablet every night for 1 week, then increase to 1 tablet every night 30 tablet 6  ? butalbital-acetaminophen-caffeine (FIORICET) 50-325-40 MG tablet Take 1 tablet three times a day as needed for  headache. 30 tablet 0  ? cetirizine (ZYRTEC) 10 MG tablet Take 1 tablet (10 mg total) by mouth daily. 30 tablet 3  ? fluticasone (FLONASE) 50 MCG/ACT nasal spray Place 2 sprays into both nostrils daily. 16 g 6  ? indomethacin (INDOCIN) 25 MG capsule Take 1 tablet as needed prior to strenuous activity 10 capsule 11  ? LORazepam (ATIVAN) 0.5 MG tablet Take 1 tablet by mouth twice daily as needed for anxiety 30 tablet 2  ? sodium chloride (OCEAN) 0.65 % nasal spray Place 1 spray into the nose as needed for congestion. 30 mL 12  ? ?No facility-administered medications prior to visit.  ? ? ?No Known Allergies ? ?ROS ?Review of Systems  ?Constitutional:  Negative for chills and fever.  ?HENT:  Negative for postnasal drip, sinus pressure  and sore throat.   ?Eyes:  Negative for pain and discharge.  ?Respiratory:  Negative for cough and shortness of breath.   ?Cardiovascular:  Negative for chest pain and palpitations.  ?Gastrointestinal:  Positive for constipation. Negative for diarrhea, nausea and vomiting.  ?Endocrine: Negative for polydipsia and polyuria.  ?Genitourinary:  Negative for dysuria and hematuria.  ?Musculoskeletal:  Negative for neck pain and neck stiffness.  ?Skin:  Negative for rash.  ?Allergic/Immunologic: Positive for environmental allergies.  ?Neurological:  Positive for dizziness, weakness and headaches. Negative for numbness.  ?Psychiatric/Behavioral:  Negative for agitation and behavioral problems. The patient is nervous/anxious.   ? ?  ?Objective:  ?  ?Physical Exam ?Vitals reviewed.  ?Constitutional:   ?   General: He is not in acute distress. ?   Appearance: He is not diaphoretic.  ?HENT:  ?   Head: Normocephalic and atraumatic.  ?   Nose: Nose normal.  ?   Mouth/Throat:  ?   Mouth: Mucous membranes are moist.  ?Eyes:  ?   General: No scleral icterus. ?   Extraocular Movements: Extraocular movements intact.  ?Cardiovascular:  ?   Rate and Rhythm: Normal rate and regular rhythm.  ?   Pulses: Normal pulses.  ?   Heart sounds: Normal heart sounds. No murmur heard. ?Pulmonary:  ?   Breath sounds: Normal breath sounds. No wheezing or rales.  ?Musculoskeletal:  ?   Cervical back: Neck supple. No tenderness.  ?   Right lower leg: No edema.  ?   Left lower leg: No edema.  ?Skin: ?   General: Skin is warm.  ?   Findings: No rash.  ?Neurological:  ?   General: No focal deficit present.  ?   Mental Status: He is alert and oriented to person, place, and time.  ?   Cranial Nerves: No cranial nerve deficit.  ?   Sensory: No sensory deficit.  ?   Motor: No weakness.  ?Psychiatric:     ?   Mood and Affect: Mood normal.     ?   Behavior: Behavior normal.  ? ? ?BP 112/82 (BP Location: Right Arm, Patient Position: Sitting, Cuff Size: Normal)    Pulse (!) 54   Resp 16   Ht '6\' 2"'$  (1.88 m)   Wt 203 lb 6.4 oz (92.3 kg)   SpO2 96%   BMI 26.12 kg/m?  ?Wt Readings from Last 3 Encounters:  ?11/29/21 203 lb 6.4 oz (92.3 kg)  ?11/13/21 202 lb 12.8 oz (92 kg)  ?10/11/21 201 lb 9.6 oz (91.4 kg)  ? ? ?No results found for: TSH ?Lab Results  ?Component Value Date  ? WBC 5.7 02/23/2020  ?  HGB 14.7 02/23/2020  ? HCT 44.9 02/23/2020  ? MCV 94.3 02/23/2020  ? PLT 216 02/23/2020  ? ?Lab Results  ?Component Value Date  ? NA 139 02/23/2020  ? K 4.1 02/23/2020  ? CO2 25 02/23/2020  ? GLUCOSE 92 02/23/2020  ? BUN 11 02/23/2020  ? CREATININE 0.97 02/23/2020  ? BILITOT 0.5 02/23/2020  ? AST 15 02/23/2020  ? ALT 21 02/23/2020  ? PROT 6.4 02/23/2020  ? ALBUMIN 5.1 08/01/2021  ? CALCIUM 9.2 02/23/2020  ? ?Lab Results  ?Component Value Date  ? CHOL 144 02/23/2020  ? ?Lab Results  ?Component Value Date  ? HDL 29 (L) 02/23/2020  ? ?Lab Results  ?Component Value Date  ? [place] 89 02/23/2020  ? ?Lab Results  ?Component Value Date  ? TRIG 164 (H) 02/23/2020  ? ?Lab Results  ?Component Value Date  ? CHOLHDL 5.0 (H) 02/23/2020  ? ?No results found for: HGBA1C ? ?  ?Assessment & Plan:  ? ?Problem List Items Addressed This Visit   ? ?  ? Nervous and Auditory  ? MS (multiple sclerosis) ([place])  ?  Followed by neurology ?Not on any treatment currently, planned to get surveillance MRI ?Has intermittent neurologic symptoms ? ?  ?  ?  ? Other  ? GAD (generalized anxiety disorder)  ?  Likely panic episodes - Ativan PRN ?Did not tolerate SSRI in the past ? ?  ?  ? Left shoulder pain - Primary  ?  Reports remote history of left shoulder injury ?Had a recent left shoulder and arm injury as well ?Unclear etiology currently, could be rotator cuff injury and/or biceps tendonitis ?Zanaflex as needed for muscle spasm/stiffness ?Tylenol or ibuprofen as needed for pain ?Referred to orthopedic surgery ? ?  ?  ? Relevant Medications  ? tiZANidine (ZANAFLEX) 4 MG tablet  ? Other Relevant Orders  ? Ambulatory  referral to Orthopedic Surgery  ? Chronic intractable headache  ?  Recently placed on amitriptyline ?Followed by neurology ? ?  ?  ? Relevant Medications  ? tiZANidine (ZANAFLEX) 4 MG tablet  ? ?Other Vi

## 2021-11-29 NOTE — Patient Instructions (Signed)
Please take Tizanidine as needed for muscle spasms/stiffness. ? ?Okay to take Tylenol for shoulder pain. ? ?Please continue to take other medications as prescribed. ? ?You are being referred to Orthopedic surgery for shoulder pain. ?

## 2021-11-29 NOTE — Assessment & Plan Note (Signed)
Followed by neurology Not on any treatment currently, planned to get surveillance MRI Has intermittent neurologic symptoms 

## 2021-11-29 NOTE — Assessment & Plan Note (Signed)
Likely panic episodes - Ativan PRN ?Did not tolerate SSRI in the past ?

## 2021-11-29 NOTE — Assessment & Plan Note (Signed)
Reports remote history of left shoulder injury ?Had a recent left shoulder and arm injury as well ?Unclear etiology currently, could be rotator cuff injury and/or biceps tendonitis ?Zanaflex as needed for muscle spasm/stiffness ?Tylenol or ibuprofen as needed for pain ?Referred to orthopedic surgery ?

## 2021-12-05 ENCOUNTER — Ambulatory Visit (INDEPENDENT_AMBULATORY_CARE_PROVIDER_SITE_OTHER): Payer: Medicaid Other

## 2021-12-05 ENCOUNTER — Encounter: Payer: Self-pay | Admitting: Orthopedic Surgery

## 2021-12-05 ENCOUNTER — Ambulatory Visit: Payer: Medicaid Other | Admitting: Orthopedic Surgery

## 2021-12-05 VITALS — BP 124/73 | HR 58 | Ht 74.0 in | Wt 200.0 lb

## 2021-12-05 DIAGNOSIS — G8929 Other chronic pain: Secondary | ICD-10-CM

## 2021-12-05 DIAGNOSIS — M25512 Pain in left shoulder: Secondary | ICD-10-CM

## 2021-12-05 NOTE — Patient Instructions (Signed)
Rotator Cuff Tear/Tendinitis Rehab  ? ?Ask your health care provider which exercises are safe for you. Do exercises exactly as told by your health care provider and adjust them as directed. It is normal to feel mild stretching, pulling, tightness, or discomfort as you do these exercises. Stop right away if you feel sudden pain or your pain gets worse. Do not begin these exercises until told by your health care provider. ?Stretching and range-of-motion exercises ? ?These exercises warm up your muscles and joints and improve the movement and flexibility of your shoulder. These exercises also help to relieve pain. ? ?Shoulder pendulum ?In this exercise, you let the injured arm dangle toward the floor and then swing it like a clock pendulum. ?Stand near a table or counter that you can hold onto for balance. ?Bend forward at the waist and let your left / right arm hang straight down. Use your other arm to support you and help you stay balanced. ?Relax your left / right arm and shoulder muscles, and move your hips and your trunk so your left / right arm swings freely. Your arm should swing because of the motion of your body, not because you are using your arm or shoulder muscles. ?Keep moving your hips and trunk so your arm swings in the following directions, as told by your health care provider: ?Side to side. ?Forward and backward. ?In clockwise and counterclockwise circles. ?Slowly return to the starting position. ?Repeat 10 times, or for 10 seconds per direction. Complete this exercise 2-3 times a day. ?  ?   ?Shoulder flexion, seated ?This exercise is sometimes called table slides. In this exercise, you raise your arm in front of your body until you feel a stretch in your injured shoulder. ?Sit in a stable chair so your left / right forearm can rest on a flat surface. Your elbow should rest at a height that keeps your upper arm next to your body. ?Keeping your left / right shoulder relaxed, lean forward at the waist  and let your hand slide forward (flexion). Stop when you feel a stretch in your shoulder, or when you reach the angle that is recommended by your health care provider. ?Hold for 5 seconds. ?Slowly return to the starting position. ?Repeat 10 times. Complete this exercise 1-2  times a day. ?      ?Shoulder flexion, standing ?In this exercise, you raise your arm in front of your body (flexion) until you feel a stretch in your injured shoulder. ?Stand and hold a broomstick, a cane, or a similar object. Place your hands a little more than shoulder-width apart on the object. Your left / right hand should be palm-up, and your other hand should be palm-down. ?Keep your elbow straight and your shoulder muscles relaxed. Push the stick up with your healthy arm to raise your left / right arm in front of your body, and then over your head until you feel a stretch in your shoulder. ?Avoid shrugging your shoulder while you raise your arm. Keep your shoulder blade tucked down toward the middle of your back. ?Keep your left / right shoulder muscles relaxed. ?Hold for 10 seconds. ?Slowly return to the starting position. ?Repeat 10 times. Complete this exercise 1-2 times a day. ?  ?   ?Shoulder abduction, active-assisted ?You will need a stick, broom handle, or similar object to help you (assist) in doing this exercise. ?Lie on your back. This is the supine position. Hold a broomstick, a cane, or a similar   object. ?Place your hands a little more than shoulder-width apart on the object. Your left / right hand should be palm-up, and your other hand should be palm-down. ?Keeping your shoulder relaxed, push the stick to raise your left / right arm out to your side (abduction) and then over your head. Use your other hand to help move the stick. Stop when you feel a stretch in your shoulder, or when you reach the angle that is recommended by your health care provider. ?Avoid shrugging your shoulder while you raise your arm. Keep your  shoulder blade tucked down toward the middle of your back. ?Hold for 10 seconds. ?Slowly return to the starting position. ?Repeat 10 times. Complete this exercise 1-2 times a day. ?  ?   ?Shoulder flexion, active-assisted ?Lie on your back. You may bend your knees for comfort. ?Hold a broomstick, a cane, or a similar object so that your hands are about shoulder-width apart. Your palms should face toward your feet. ?Raise your left / right arm over your head, then behind your head toward the floor (flexion). Use your other hand to help you do this (active-assisted). Stop when you feel a gentle stretch in your shoulder, or when you reach the angle that is recommended by your health care provider. ?Hold for 10 seconds. ?Use the stick and your other arm to help you return your left / right arm to the starting position. ?Repeat 10 times. Complete this exercise 1-2 times a day. ?  ?   ?External rotation ?Sit in a stable chair without armrests, or stand up. ?Tuck a soft object, such as a folded towel or a small ball, under your left / right upper arm. ?Hold a broomstick, a cane, or a similar object with your palms face-down, toward the floor. Bend your elbows to a 90-degree angle (right angle), and keep your hands about shoulder-width apart. ?Straighten your healthy arm and push the stick across your body, toward your left / right side. Keep your left / right arm bent. This will rotate your left / right forearm away from your body (external rotation). ?Hold for 10 seconds. ?Slowly return to the starting position. ?Repeat 10 times. Complete this exercise 1-2 times a day.  ?   ? ? ? ?Strengthening exercises ?These exercises build strength and endurance in your shoulder. Endurance is the ability to use your muscles for a long time, even after they get tired. Do not start doing these exercises until your health care provider approves. ?Shoulder flexion, isometric ?Stand or sit in a doorway, facing the door frame. ?Keep your  left / right arm straight and make a gentle fist with your hand. Place your fist against the door frame. Only your fist should be touching the frame. Keep your upper arm at your side. ?Gently press your fist against the door frame, as if you are trying to raise your arm above your head (isometric shoulder flexion). ?Avoid shrugging your shoulder while you press your hand into the door frame. Keep your shoulder blade tucked down toward the middle of your back. ?Hold for 10 seconds. ?Slowly release the tension, and relax your muscles completely before you repeat the exercise. ?Repeat 10 times. Complete this exercise 3 times per week. ?     ?Shoulder abduction, isometric ?Stand or sit in a doorway. Your left / right arm should be closest to the door frame. ?Keep your left / right arm straight, and place the back of your hand against the door frame. Only   your hand should be touching the frame. Keep the rest of your arm close to your side. ?Gently press the back of your hand against the door frame, as if you are trying to raise your arm out to the side (isometric shoulder abduction). ?Avoid shrugging your shoulder while you press your hand into the door frame. Keep your shoulder blade tucked down toward the middle of your back. ?Hold for 10 seconds. ?Slowly release the tension, and relax your muscles completely before you repeat the exercise. ?Repeat 10 times. Complete this exercise 3 times per week. ?  ?   ?Internal rotation, isometric ?This is an exercise in which you press your palm against a door frame without moving your shoulder joint (isometric). ?Stand or sit in a doorway, facing the door frame. ?Bend your left / right elbow, and place the palm of your hand against the door frame. Only your palm should be touching the frame. Keep your upper arm at your side. ?Gently press your hand against the door frame, as if you are trying to push your arm toward your abdomen (internal rotation). Gradually increase the  pressure until you are pressing as hard as you can. Stop increasing the pressure if you feel shoulder pain. ?Avoid shrugging your shoulder while you press your hand into the door frame. Keep your shoulder blade tuck

## 2021-12-06 ENCOUNTER — Encounter: Payer: Self-pay | Admitting: Orthopedic Surgery

## 2021-12-06 NOTE — Progress Notes (Signed)
New Patient Visit ? ?Assessment: ?Benjamin Waters is a 46 y.o. male with the following: ?1. Chronic left shoulder pain ? ?Plan: ?Ovila Lepage has upper back pain, with with pain into his left shoulder.  He has restricted range of motion, which is painful.  No specific injury.  Radiographs are normal.  He has recently been diagnosed with MS, and states has been dealing with some issues medically.  As a result, his pain and function has progressively worsened.  He does not have a classic presentation for frozen shoulder, but his pain and range of motion are certainly restricted.  As result, I recommended an image guided, high-volume steroid injection.  We will refer him to Dr. Thedore Mins.  I do think this will benefit him.  If he has any further issues, have asked him to contact the clinic.  Follow-up as needed. ? ?Follow-up: ?Return if symptoms worsen or fail to improve. ? ?Subjective: ? ?Chief Complaint  ?Patient presents with  ? Shoulder Pain  ?  LEFT shoulder down into upper arm  ? New Patient (Initial Visit)  ? ? ?History of Present Illness: ?Benjamin Waters is a 46 y.o. male who has been referred by  Ihor Dow, MD for evaluation of left shoulder pain.  He reports that he injured his shoulder many years ago.  He would have some discomfort off and on.  It has progressively worsened recently.  He has been dealing with a recent diagnosis of MS.  Pain is primarily in the upper back, just medial to the scapula.  He also has pain that has progressed into the left shoulder.  He has some difficulty with overhead motion.  It does radiate distally.  Medications are not providing any sustained relief.  He is yet to have an injection.  He has not worked with physical therapy. ? ? ?Review of Systems: ?No fevers or chills ?No numbness or tingling ?No chest pain ?No shortness of breath ?No bowel or bladder dysfunction ?No GI distress ?No headaches ? ? ?Medical History: ? ?Past Medical History:  ?Diagnosis Date  ? Anxiety   ?  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 ? ?No past surgical history on file. ? ?Family History  ?Problem Relation Age of Onset  ? Lung cancer Father   ? Hypertension Sister   ? Cancer Sister   ? Drug abuse Sister   ? ?Social History  ? ?Tobacco Use  ? Smoking status: Some Days  ?  Types: Cigarettes  ?  Last attempt to quit: 09/09/2015  ?  Years since quitting: 6.2  ? Smokeless tobacco: Never  ?Vaping Use  ? Vaping Use: Never used  ?Substance Use Topics  ? Alcohol use: Yes  ?  Alcohol/week: 10.0 standard drinks  ?  Types: 10 Cans of beer per week  ? Drug use: Not Currently  ?  Types: Marijuana  ? ? ?No Known Allergies ? ?Current Meds  ?Medication Sig  ? amitriptyline (ELAVIL) 25 MG tablet Take 1/2 tablet every night for 1 week, then increase to 1 tablet every night  ? butalbital-acetaminophen-caffeine (FIORICET) 50-325-40 MG tablet Take 1 tablet three times a day as needed for headache.  ? cetirizine (ZYRTEC) 10 MG tablet Take 1 tablet (10 mg total) by mouth daily.  ? fluticasone (FLONASE) 50 MCG/ACT nasal spray Place 2 sprays into both nostrils daily.  ? indomethacin (INDOCIN) 25 MG capsule Take 1 tablet as needed prior to strenuous activity  ? LORazepam (ATIVAN) 0.5 MG tablet Take 1 tablet by  mouth twice daily as needed for anxiety  ? sodium chloride (OCEAN) 0.65 % nasal spray Place 1 spray into the nose as needed for congestion.  ? ? ?Objective: ?BP 124/73   Pulse (!) 58   Ht '6\' 2"'$  (1.88 m)   Wt 200 lb (90.7 kg)   BMI 25.68 kg/m?  ? ?Physical Exam: ? ?General: Alert and oriented. and No acute distress. ?Gait: Normal gait. ? ?Left shoulder without deformity.  No atrophy is appreciated.  Mild muscular tenderness just medial to the left scapula.  He has approximately 160 degrees of forward elevation, before it is painful.  30 degrees of external rotation, compared to 45 on the contralateral side.  In 90 degrees of abduction, he only has approximately 60 degrees of external rotation.  4+/5 strength in the left  shoulder.  Fingers are warm and well-perfused.  2+ radial pulse. ? ?IMAGING: ?I personally ordered and reviewed the following images ? ?X-rays left shoulder obtained in clinic today.  No acute injuries are noted.  Well-maintained glenohumeral joint space.  Mildly elevated clavicle, in relation to the acromion.  No evidence of proximal humeral migration. ? ?Impression: Normal left shoulder x-ray ? ? ?New Medications:  ?No orders of the defined types were placed in this encounter. ? ? ? ? ?Mordecai Rasmussen, MD ? ?12/06/2021 ?8:08 AM ? ? ?

## 2022-01-17 ENCOUNTER — Ambulatory Visit (INDEPENDENT_AMBULATORY_CARE_PROVIDER_SITE_OTHER): Payer: Medicaid Other | Admitting: *Deleted

## 2022-01-17 DIAGNOSIS — E538 Deficiency of other specified B group vitamins: Secondary | ICD-10-CM

## 2022-01-17 MED ORDER — CYANOCOBALAMIN 1000 MCG/ML IJ SOLN
1000.0000 ug | Freq: Once | INTRAMUSCULAR | Status: AC
  Administered 2022-01-17: 1000 ug via INTRAMUSCULAR

## 2022-02-28 ENCOUNTER — Ambulatory Visit (INDEPENDENT_AMBULATORY_CARE_PROVIDER_SITE_OTHER): Payer: Medicaid Other

## 2022-02-28 DIAGNOSIS — E538 Deficiency of other specified B group vitamins: Secondary | ICD-10-CM

## 2022-02-28 MED ORDER — CYANOCOBALAMIN 1000 MCG/ML IJ SOLN
1000.0000 ug | Freq: Once | INTRAMUSCULAR | Status: AC
  Administered 2022-02-28: 1000 ug via INTRAMUSCULAR

## 2022-02-28 NOTE — Progress Notes (Signed)
B-12 SHOT

## 2022-03-15 ENCOUNTER — Ambulatory Visit
Admission: RE | Admit: 2022-03-15 | Discharge: 2022-03-15 | Disposition: A | Discharge status: Home / Self Care | Payer: Medicaid Other | Source: Ambulatory Visit | Attending: Neurology | Admitting: Neurology

## 2022-03-15 DIAGNOSIS — G35 Multiple sclerosis: Secondary | ICD-10-CM

## 2022-03-15 DIAGNOSIS — R519 Headache, unspecified: Secondary | ICD-10-CM

## 2022-03-15 DIAGNOSIS — G35D Multiple sclerosis, unspecified: Secondary | ICD-10-CM

## 2022-03-15 MED ORDER — GADOBENATE DIMEGLUMINE 529 MG/ML IV SOLN
18.0000 mL | Freq: Once | INTRAVENOUS | Status: AC | PRN
Start: 2022-03-15 — End: 2022-03-15
  Administered 2022-03-15: 18 mL via INTRAVENOUS

## 2022-03-16 ENCOUNTER — Telehealth: Payer: Self-pay

## 2022-03-16 NOTE — Telephone Encounter (Signed)
Noted  

## 2022-04-11 ENCOUNTER — Ambulatory Visit (INDEPENDENT_AMBULATORY_CARE_PROVIDER_SITE_OTHER): Payer: Medicaid Other | Admitting: *Deleted

## 2022-04-11 DIAGNOSIS — E538 Deficiency of other specified B group vitamins: Secondary | ICD-10-CM | POA: Diagnosis not present

## 2022-04-11 MED ORDER — CYANOCOBALAMIN 1000 MCG/ML IJ SOLN
1000.0000 ug | Freq: Once | INTRAMUSCULAR | Status: AC
  Administered 2022-04-11: 1000 ug via INTRAMUSCULAR

## 2022-04-17 ENCOUNTER — Ambulatory Visit: Payer: Medicaid Other | Admitting: Neurology

## 2022-04-17 ENCOUNTER — Encounter: Payer: Self-pay | Admitting: Neurology

## 2022-04-17 VITALS — BP 112/65 | HR 88 | Resp 18 | Ht 74.0 in | Wt 206.0 lb

## 2022-04-17 DIAGNOSIS — R251 Tremor, unspecified: Secondary | ICD-10-CM | POA: Diagnosis not present

## 2022-04-17 DIAGNOSIS — G35 Multiple sclerosis: Secondary | ICD-10-CM | POA: Diagnosis not present

## 2022-04-17 DIAGNOSIS — R519 Headache, unspecified: Secondary | ICD-10-CM | POA: Diagnosis not present

## 2022-04-17 NOTE — Patient Instructions (Signed)
Good to see you. Try wearing the elbow brace to protect the ulnar nerve at the elbow. Follow-up in 6 months, call for any changes.

## 2022-04-17 NOTE — Progress Notes (Signed)
NEUROLOGY FOLLOW UP OFFICE NOTE  Benjamin Waters [id number] [birth year]  HISTORY OF PRESENT ILLNESS: I had the pleasure of seeing Benjamin Waters in follow-up in the neurology clinic on 04/17/2022.  The patient was last seen 5 months ago. He was initially seen for episodes of upper body stiffening with low amplitude shaking. EEG was normal in 06/2020. As part of his workup, he had a brain MRI which showed changes concerning for demyelinating disease. Repeat brain MRI with and without contrast done 10/10/2021 did not show any enhancing lesions seen, no changes in the white matter changes seen bilaterally,in the juxtacortical regions, and largest in the left periventricular region. There were no spinal cord lesions on cervical/thoracic MRI. LP showed  >5 well defined gamma restriction bands in CSF with additional identical gamma restriction bands in the CSF and serum, indicative of intra-cerebral as well as systemic synthesis of gammaglobulins. CNS IgG synthesis elevated 18.3, IgG index elevated 1.94.   On his last visit, he continued to report headaches that were mostly exertional and positional. He was started on amitriptyline for headache prophylaxis. We had discussed disease-modifying agents for MS and agreed to repeat MRI brain with and without contrast first. I reviewed repeat study done 03/15/22 which was stable, no new or enhancing lesions seen. Since his last visit, he reports the headaches are better, he had weaned off the amitriptyline and Indomethacin because even if they are still occurring several times a week (mostly exertional), he can still continue his activities. He has had only a few really bad headaches. He stays fatigued and never has enough energy. He reports a lot of stress. He has started vitamin B12 injections through his PCP and takes vitamin D supplements. He has occasional numbness in his nose, lips, fingertips and toes, but states physically he is not doing so bad.Sometimes he may  drag either leg, R>L. No bowel/bladder dysfunction. He takes Ativan more when stressed out. He would have the weird feeling in his chest and throat like a bubble comes up "like a burp trapped," he starts to panic and shakes. He takes the Ativan. Symptoms can last a couple of hours if he does not take Ativan. They occur randomly, he could go a couple of weeks without any. No loss of awareness/consciousness. No falls.    History On Initial Assessment 06/05/2021: This is a pleasant 46 year old ambidextrous right hand dominant man with a history of GAD/panic disorder, presenting for evaluation of seizure-like activity. He started having intermittent episodes around 5 years ago, that became progressively worse that they were occurring daily close to 2 years ago. He can sometimes tell when they would start, he would feel weird with a physical sensation that something is wrong with him. Sometimes there is a pressure on the left side of his chest and he feels his heart beating fast or irregularly. He has a wrist BP cuff at home and it would show an irregular rhythm. His upper body would then stiffen/contract with low amplitude shaking. His fists may clench. He has to consciously focus on his breathing and relax his body, which may or may not help. There would be no loss of consciousness but a few times he felt like he would pass out when he had gotten lightheaded. There was one time a few days ago where he had a weird taste in his mouth like metal and he noticed he was feeling nervous/anxious with sweat on his face. He attributed to the taste to shaving cream in  his mouth. He was prescribed Ativan close to 2 years ago which has helped a lot. Episodes are not occurring daily, he has around 1-2 a month. No nocturnal episodes. He is able to catch it now and take the Ativan, if he does not take it soon enough, he takes an additional dose. In the past 3 weeks, he has taken 10-14 tablets. He notices random muscle twitches on  his chest, thumb, or different parts of his body. He injured his left shoulder which also causes some anxiety. He lives with his partner, 20 year old son, and 12yo nephew, and has not been told of any staring/unresponsive episodes. He denies any loss of time. He denies any significant headaches, dizziness, diplopia, dysarthria/dysphagia, bladder dysfunction. He has neck/back pain and constipation.   He reports an incident in 2008 when he woke up with the right side of his mouth paralyzed. He went to Urgent Care where "they thought it was Bell's palsy" and prescribed Prednisone. Over the next 1-2 days, he had progressive weakness on the right arm then right leg where he needed to pull his right leg up or use his left arm to lift the right. He could move his right hand but there was no feeling in it, he could not write with it. It took 3 months to improve, and was normal in 6 months. He still notices a little droop on the right side of his mouth when smiling and coordination on the right hand is not as good, but feels 98% better. He has occasional numbness and tingling in his fingers and toes. No falls. Sleep is good. Mood "depends on the day," he reports being very stressed. 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 Otherwise he had a normal birth and early development.  There is no history of febrile convulsions, CNS infections such as meningitis/encephalitis, significant traumatic brain injuries, neurosurgical procedures.   Diagnostic Data: MRI brain without contrast done 06/2021 did not show any acute changes. There were multiple white matter changes seen in the periventricular, deep, and juxtacortical regions with a dominant 1.9cm lesion involving the periventricular white matter at the posterior left frontal region. Few of the foci radiate from the lateral ventricles in a perpendicular fashion, with few corresponding T1 black holes.   Cervical/thoracic MRI done 09/2021: No spinal lesions  seen.   Lumbar puncture 07/2021: CSF showed a  WBC of 7 with 93 lymphs, normal protein 32 and glucose 57. Culture was negative, no malignant cells, ACE 3. 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 CNS IgG synthesis elevated 18.3, IgG index elevated 1.94.   Normal wake and sleep EEG in 06/2021.   Bloodwork done was negative for autoimmune workup, his B12 level was 176, he has started B12 injections.  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 30 tablet 0   cetirizine (ZYRTEC) 10 MG tablet Take 1 tablet (10 mg total) by mouth daily.  30 tablet 3   fluticasone (FLONASE) 50 MCG/ACT nasal spray Place 2 sprays into both nostrils daily. 16 g 6   indomethacin (INDOCIN) 25 MG capsule Take 1 tablet as needed prior to strenuous activity 10 capsule 11   LORazepam (ATIVAN) 0.5 MG tablet Take 1 tablet by mouth twice daily as needed for anxiety 30 tablet 2   sodium chloride (OCEAN) 0.65 % nasal spray Place 1 spray into the nose as needed for congestion. 30 mL 12   tiZANidine (ZANAFLEX) 4 MG tablet Take 1 tablet (4 mg total) by mouth every 8 (eight) hours as needed for muscle spasms. 30 tablet 1   No current facility-administered medications on file prior to visit.    ALLERGIES: No Known Allergies  FAMILY HISTORY: Family History  Problem Relation Age of Onset   Lung cancer Father    Hypertension Sister    Cancer  Sister    Drug abuse Sister     SOCIAL HISTORY: Social History   Socioeconomic History   Marital status: Soil scientist    Spouse name: Not on file   Number of children: Not on file   Years of education: Not on file   Highest education level: Not on file  Occupational History   Not on file  Tobacco Use   Smoking status: Some Days    Types: Cigarettes    Last attempt to quit: 09/09/2015    Years since quitting: 6.6   Smokeless tobacco: Never  Vaping Use   Vaping Use: Never used  Substance and Sexual Activity   Alcohol use: Yes    Alcohol/week: 10.0 standard drinks of alcohol    Types: 10 Cans of beer per week   Drug use: Not Currently    Types: Marijuana   Sexual activity: Yes  Other Topics Concern   Not on file  Social History Narrative   [employer] Gentile of 11 years.Lives with fiancee.Currently unemployed.Thinking of going into IT.   Right handed    Social Determinants of Health   Financial Resource Strain: Not on file  Food Insecurity: Not on file  Transportation Needs: Not on file  Physical Activity: Not on file  Stress: Not on file  Social Connections: Not on file  Intimate Partner Violence: Not on file     PHYSICAL EXAM: Vitals:   04/17/22 1403  BP: 112/65  Pulse: 88  Resp: 18  SpO2: 98%   General: No acute distress Head:  Normocephalic/atraumatic Skin/Extremities: No rash, no edema Neurological Exam: alert and awake. No aphasia or dysarthria. Fund of knowledge is appropriate.Attention and concentration are normal.   Cranial nerves: Pupils equal, round. Extraocular movements intact with no nystagmus. Visual fields full.  No facial asymmetry.  Motor: Bulk and tone normal, muscle strength 5/5 throughout with no pronator drift.   Finger to nose testing intact.  Gait narrow-based and steady, able to tandem walk adequately.  Romberg negative. +Tinel sign at the left elbow.   IMPRESSION: This is a pleasant 46 yo ambidextrous right hand dominant man with a history  of GAD/panic disorder who presented for evaluation of upper body stiffening/shaking with no loss of consciousness. EEG normal. These appear more anxiety/stress-related, we discussed we can do a prolonged EEG in the future if they continue despite better stress management. As part of his evaluation, he had a brain MRI showing white matter changes concerning for demyelinating disease, follow-up MRI no enhancing lesions. MRI cervical/thoracic spine normal. Spinal tap showed >5 OCB in both CSF and serum. Repeat MRI  brain 03/2022 stable. We again discussed starting disease modifying treatment for MS, risks and benefits, and at this time he would like to continue to monitor symptoms and do interval annual brain MRI (earlier if any change in symptoms). 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 Headaches improved, he is off medication. Follow-up in 6 months, call for any changes.    Thank you for allowing me to participate in his care.  Please do not hesitate to call for any questions or concerns.    Ellouise Newer, M.D.   CC: Dr. Posey Pronto

## 2022-04-27 ENCOUNTER — Other Ambulatory Visit: Payer: Self-pay | Admitting: Internal Medicine

## 2022-04-27 DIAGNOSIS — F411 Generalized anxiety disorder: Secondary | ICD-10-CM

## 2022-05-01 ENCOUNTER — Other Ambulatory Visit: Payer: Self-pay | Admitting: Internal Medicine

## 2022-05-01 DIAGNOSIS — J309 Allergic rhinitis, unspecified: Secondary | ICD-10-CM

## 2022-05-01 DIAGNOSIS — F411 Generalized anxiety disorder: Secondary | ICD-10-CM

## 2022-05-09 ENCOUNTER — Ambulatory Visit (INDEPENDENT_AMBULATORY_CARE_PROVIDER_SITE_OTHER): Payer: Medicaid Other | Admitting: *Deleted

## 2022-05-09 DIAGNOSIS — E538 Deficiency of other specified B group vitamins: Secondary | ICD-10-CM

## 2022-05-09 MED ORDER — CYANOCOBALAMIN 1000 MCG/ML IJ SOLN
1000.0000 ug | Freq: Once | INTRAMUSCULAR | Status: AC
  Administered 2022-05-09: 1000 ug via INTRAMUSCULAR

## 2022-06-08 ENCOUNTER — Ambulatory Visit (INDEPENDENT_AMBULATORY_CARE_PROVIDER_SITE_OTHER): Payer: Medicaid Other | Admitting: Internal Medicine

## 2022-06-08 ENCOUNTER — Encounter: Payer: Self-pay | Admitting: Internal Medicine

## 2022-06-08 VITALS — BP 112/70 | HR 60 | Resp 18 | Ht 74.0 in | Wt 200.4 lb

## 2022-06-08 DIAGNOSIS — J0100 Acute maxillary sinusitis, unspecified: Secondary | ICD-10-CM

## 2022-06-08 DIAGNOSIS — Z2821 Immunization not carried out because of patient refusal: Secondary | ICD-10-CM

## 2022-06-08 DIAGNOSIS — F411 Generalized anxiety disorder: Secondary | ICD-10-CM

## 2022-06-08 DIAGNOSIS — E559 Vitamin D deficiency, unspecified: Secondary | ICD-10-CM

## 2022-06-08 DIAGNOSIS — Z1159 Encounter for screening for other viral diseases: Secondary | ICD-10-CM

## 2022-06-08 DIAGNOSIS — E538 Deficiency of other specified B group vitamins: Secondary | ICD-10-CM

## 2022-06-08 DIAGNOSIS — E782 Mixed hyperlipidemia: Secondary | ICD-10-CM

## 2022-06-08 DIAGNOSIS — G35 Multiple sclerosis: Secondary | ICD-10-CM | POA: Diagnosis not present

## 2022-06-08 DIAGNOSIS — Z131 Encounter for screening for diabetes mellitus: Secondary | ICD-10-CM

## 2022-06-08 DIAGNOSIS — Z0001 Encounter for general adult medical examination with abnormal findings: Secondary | ICD-10-CM

## 2022-06-08 MED ORDER — AZITHROMYCIN 250 MG PO TABS: ORAL_TABLET | ORAL | 0 refills | Status: AC

## 2022-06-08 MED ORDER — CYANOCOBALAMIN 1000 MCG/ML IJ SOLN
1000.0000 ug | Freq: Once | INTRAMUSCULAR | Status: AC
  Administered 2022-06-08: 1000 ug via INTRAMUSCULAR

## 2022-06-08 NOTE — Assessment & Plan Note (Addendum)
Physical exam as documented. Fasting blood tests today. Does not take flu vaccine.

## 2022-06-08 NOTE — Assessment & Plan Note (Addendum)
Likely panic episodes - Ativan PRN Did not tolerate SSRI in the past Uses marijuana, strongly advised to avoid marijuana use as he is taking BZD

## 2022-06-08 NOTE — Patient Instructions (Signed)
Please continue to take medications as prescribed.  Please continue to follow heart healthy diet and perform moderate exercise/walking at least 150 mins/week.  

## 2022-06-08 NOTE — Assessment & Plan Note (Signed)
Followed by neurology Not on any treatment currently, planned to get surveillance MRI Has intermittent neurologic symptoms

## 2022-06-08 NOTE — Progress Notes (Signed)
Established Patient Office Visit  Subjective:  Patient ID: Benjamin Waters, male    DOB: 1976/07/06  Age: 46 y.o. MRN: [id number]  CC:  Chief Complaint  Patient presents with   Annual Exam    Annual exam sore throat has had sinus issues for 2 weeks     HPI Benjamin Waters is a 46 y.o. male with past medical history of GAD, MS and allergic sinusitis who presents for annual physical.  He complains of worsening of nasal congestion, postnasal drip, sore throat and cough for the last 2 weeks.  He denies any fever or chills currently.  He has been taking Zyrtec for allergies.  He has also tried OTC generic Mucinex without much relief.  He has history of panic episodes, but does not recall any triggers.  He denies any dyspnea during the episode, but feels enclosed in a space, and has headache, chest pain, diffuse muscle aches and blurry vision. He also reports clenching of his hands and jerking of UE.  He takes Ativan as needed for panic episodes.  He has been followed by neurologist for intermittent jerking movements, numbness and weakness of the facial area and weakness of RUE and RLE.  He has been diagnosed with MS, but he prefers not to start any treatment for now.  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 No pertinent surgical history.  Family History  Problem Relation Age of Onset   Lung cancer Father    Hypertension Sister    Cancer Sister    Drug abuse Sister     Social History   Socioeconomic History   Marital status: Soil scientist    Spouse name: Not on file   Number of children: Not on file   Years of education: Not on file   Highest education level: Not on file  Occupational History   Not on file  Tobacco Use   Smoking status: Some Days    Types: Cigarettes    Last attempt to quit: 09/09/2015    Years since quitting: 6.7   Smokeless tobacco: Never  Vaping Use   Vaping Use: Never used   Substance and Sexual Activity   Alcohol use: Yes    Alcohol/week: 10.0 standard drinks of alcohol    Types: 10 Cans of beer per week   Drug use: Not Currently    Types: Marijuana   Sexual activity: Yes  Other Topics Concern   Not on file  Social History Narrative   [employer] of 11 years.Lives with fiancee.Currently unemployed.Thinking of going into IT.   Right handed    Social Determinants of Health   Financial Resource Strain: Not on file  Food Insecurity: Not on file  Transportation Needs: Not on file  Physical Activity: Not on file  Stress: Not on file  Social Connections: Not on file  Intimate Partner Violence: Not on file    Outpatient Medications Prior to Visit  Medication Sig Dispense Refill   cetirizine (ZYRTEC) 10 MG tablet Take 1 tablet by mouth once daily 30 tablet 11   fluticasone (FLONASE) 50 MCG/ACT nasal spray Place 2 sprays into both nostrils daily. 16 g 6   LORazepam (ATIVAN) 0.5 MG tablet Take 1 tablet by mouth twice daily as needed for anxiety 30 tablet 0   sodium chloride (OCEAN) 0.65 % nasal spray Place 1 spray into the nose as needed for congestion. 30 mL 12   tiZANidine (ZANAFLEX)  4 MG tablet Take 1 tablet (4 mg total) by mouth every 8 (eight) hours as needed for muscle spasms. 30 tablet 1   No facility-administered medications prior to visit.    No Known Allergies  ROS Review of Systems  Constitutional:  Negative for chills and fever.  HENT:  Positive for congestion, postnasal drip, sinus pressure and sore throat.   Eyes:  Negative for pain and discharge.  Respiratory:  Positive for cough. Negative for shortness of breath.   Cardiovascular:  Negative for chest pain and palpitations.  Gastrointestinal:  Positive for constipation. Negative for diarrhea, nausea and vomiting.  Endocrine: Negative for polydipsia and polyuria.  Genitourinary:  Negative for dysuria and hematuria.  Musculoskeletal:  Negative for neck pain and neck stiffness.  Skin:   Negative for rash.  Allergic/Immunologic: Positive for environmental allergies.  Neurological:  Positive for dizziness, weakness and headaches. Negative for numbness.  Psychiatric/Behavioral:  Negative for agitation and behavioral problems. The patient is nervous/anxious.       Objective:    Physical Exam Vitals reviewed.  Constitutional:      General: He is not in acute distress.    Appearance: He is not diaphoretic.  HENT:     Head: Normocephalic and atraumatic.     Nose: Congestion present.     Right Sinus: Maxillary sinus tenderness present. No frontal sinus tenderness.     Left Sinus: Maxillary sinus tenderness present. No frontal sinus tenderness.     Mouth/Throat:     Mouth: Mucous membranes are moist.  Eyes:     General: No scleral icterus.    Extraocular Movements: Extraocular movements intact.  Cardiovascular:     Rate and Rhythm: Normal rate and regular rhythm.     Heart sounds: Normal heart sounds. No murmur heard. Pulmonary:     Breath sounds: Normal breath sounds. No wheezing or rales.  Abdominal:     Palpations: Abdomen is soft.     Tenderness: There is no abdominal tenderness.  Musculoskeletal:     Cervical back: Neck supple. No tenderness.     Right lower leg: No edema.     Left lower leg: No edema.  Skin:    General: Skin is warm.     Findings: No rash.  Neurological:     General: No focal deficit present.     Mental Status: He is alert and oriented to person, place, and time.     Cranial Nerves: No cranial nerve deficit.     Sensory: No sensory deficit.     Motor: No weakness.  Psychiatric:        Mood and Affect: Mood normal.        Behavior: Behavior normal.     BP 112/70 (BP Location: Right Arm, Patient Position: Sitting, Cuff Size: Normal)   Pulse 60   Resp 18   Ht _0  (1.88 m)   Wt 200 lb 6.4 oz (90.9 kg)   SpO2 97%   BMI 25.73 kg/m  Wt Readings from Last 3 Encounters:  06/08/22 200 lb 6.4 oz (90.9 kg)  04/17/22 206 lb (93.4 kg)   12/05/21 200 lb (90.7 kg)    No results found for: "TSH" Lab Results  Component Value Date   WBC 5.7 02/23/2020   HGB 14.7 02/23/2020   HCT 44.9 02/23/2020   MCV 94.3 02/23/2020   PLT 216 02/23/2020   Lab Results  Component Value Date   NA 139 02/23/2020   K 4.1 02/23/2020   CO2  25 02/23/2020   GLUCOSE 92 02/23/2020   BUN 11 02/23/2020   CREATININE 0.97 02/23/2020   BILITOT 0.5 02/23/2020   AST 15 02/23/2020   ALT 21 02/23/2020   PROT 6.4 02/23/2020   ALBUMIN 5.1 08/01/2021   CALCIUM 9.2 02/23/2020   Lab Results  Component Value Date   CHOL 144 02/23/2020   Lab Results  Component Value Date   HDL 29 (L) 02/23/2020   Lab Results  Component Value Date   LDLCALC 89 02/23/2020   Lab Results  Component Value Date   TRIG 164 (H) 02/23/2020   Lab Results  Component Value Date   CHOLHDL 5.0 (H) 02/23/2020   No results found for: "HGBA1C"    Assessment & Plan:   Problem List Items Addressed This Visit       Nervous and Auditory   MS (multiple sclerosis) ([place])    Followed by neurology Not on any treatment currently, planned to get surveillance MRI Has intermittent neurologic symptoms      Relevant Orders   TSH   CMP14+EGFR   CBC with Differential/Platelet     Other   GAD (generalized anxiety disorder)    Likely panic episodes - Ativan PRN Did not tolerate SSRI in the past Uses marijuana, strongly advised to avoid marijuana use as he is taking BZD      Refused influenza vaccine   Encounter for general adult medical examination with abnormal findings - Primary    Physical exam as documented. Fasting blood tests today. Does not take flu vaccine.      Other Visit Diagnoses     Need for hepatitis C screening test       Relevant Orders   Hepatitis C Antibody   Vitamin D deficiency       Relevant Orders   VITAMIN D 25 Hydroxy (Vit-D Deficiency, Fractures)   Mixed hyperlipidemia       Relevant Orders   Lipid Profile   Screening for diabetes  mellitus       Relevant Orders   Hemoglobin A1c   Acute non-recurrent maxillary sinusitis     Has history of allergic sinusitis Symptoms worse recently despite trying symptomatic treatment Started empiric azithromycin Advised to use nasal saline spray as needed for nasal congestion   Relevant Medications   azithromycin (ZITHROMAX) 250 MG tablet   B12 deficiency       Relevant Medications   cyanocobalamin (VITAMIN B12) injection 1,000 mcg (Completed)   Other Relevant Orders   B12       Meds ordered this encounter  Medications   cyanocobalamin (VITAMIN B12) injection 1,000 mcg   azithromycin (ZITHROMAX) 250 MG tablet    Sig: Take 2 tablets on day 1, then 1 tablet daily on days 2 through 5    Dispense:  6 tablet    Refill:  0    Follow-up: Return in about 6 months (around 12/08/2022) for GAD.    Lindell Spar, MD

## 2022-06-09 LAB — HEMOGLOBIN A1C
Est. average glucose Bld gHb Est-mCnc: 105 mg/dL
Hgb A1c MFr Bld: 5.3 % (ref 4.8–5.6)

## 2022-06-09 LAB — CMP14+EGFR
ALT: 18 IU/L (ref 0–44)
AST: 17 IU/L (ref 0–40)
Albumin/Globulin Ratio: 1.8 (ref 1.2–2.2)
Albumin: 4.3 g/dL (ref 4.1–5.1)
Alkaline Phosphatase: 80 IU/L (ref 44–121)
BUN/Creatinine Ratio: 13 (ref 9–20)
BUN: 13 mg/dL (ref 6–24)
Bilirubin Total: 0.3 mg/dL (ref 0.0–1.2)
CO2: 23 mmol/L (ref 20–29)
Calcium: 9.3 mg/dL (ref 8.7–10.2)
Chloride: 105 mmol/L (ref 96–106)
Creatinine, Ser: 1.02 mg/dL (ref 0.76–1.27)
Globulin, Total: 2.4 g/dL (ref 1.5–4.5)
Glucose: 104 mg/dL — ABNORMAL HIGH (ref 70–99)
Potassium: 4.7 mmol/L (ref 3.5–5.2)
Sodium: 140 mmol/L (ref 134–144)
Total Protein: 6.7 g/dL (ref 6.0–8.5)
eGFR: 93 mL/min/{1.73_m2} (ref >59)

## 2022-06-09 LAB — CBC WITH DIFFERENTIAL/PLATELET
Basophils Absolute: 0 10*3/uL (ref 0.0–0.2)
Basos: 1 %
EOS (ABSOLUTE): 0.1 10*3/uL (ref 0.0–0.4)
Eos: 2 %
Hematocrit: 43.7 % (ref 37.5–51.0)
Hemoglobin: 15 g/dL (ref 13.0–17.7)
Immature Grans (Abs): 0 10*3/uL (ref 0.0–0.1)
Immature Granulocytes: 0 %
Lymphocytes Absolute: 2.1 10*3/uL (ref 0.7–3.1)
Lymphs: 26 %
MCH: 31.3 pg (ref 26.6–33.0)
MCHC: 34.3 g/dL (ref 31.5–35.7)
MCV: 91 fL (ref 79–97)
Monocytes Absolute: 0.6 10*3/uL (ref 0.1–0.9)
Monocytes: 8 %
Neutrophils Absolute: 4.9 10*3/uL (ref 1.4–7.0)
Neutrophils: 63 %
Platelets: 220 10*3/uL (ref 150–450)
RBC: 4.8 x10E6/uL (ref 4.14–5.80)
RDW: 12.4 % (ref 11.6–15.4)
WBC: 7.8 10*3/uL (ref 3.4–10.8)

## 2022-06-09 LAB — TSH: TSH: 2.41 u[IU]/mL (ref 0.450–4.500)

## 2022-06-09 LAB — LIPID PANEL
Chol/HDL Ratio: 4.9 ratio (ref 0.0–5.0)
Cholesterol, Total: 132 mg/dL (ref 100–199)
HDL: 27 mg/dL — ABNORMAL LOW (ref >39)
LDL Chol Calc (NIH): 76 mg/dL (ref 0–99)
Triglycerides: 168 mg/dL — ABNORMAL HIGH (ref 0–149)
VLDL Cholesterol Cal: 29 mg/dL (ref 5–40)

## 2022-06-09 LAB — HEPATITIS C ANTIBODY: Hep C Virus Ab: NONREACTIVE

## 2022-06-09 LAB — VITAMIN D 25 HYDROXY (VIT D DEFICIENCY, FRACTURES): Vit D, 25-Hydroxy: 24.4 ng/mL — ABNORMAL LOW (ref 30.0–100.0)

## 2022-06-09 LAB — VITAMIN B12: Vitamin B-12: >2000 pg/mL — ABNORMAL HIGH (ref 232–1245)

## 2022-06-25 ENCOUNTER — Telehealth: Payer: Medicaid Other | Admitting: Physician Assistant

## 2022-06-25 DIAGNOSIS — J019 Acute sinusitis, unspecified: Secondary | ICD-10-CM | POA: Diagnosis not present

## 2022-06-25 DIAGNOSIS — B9689 Other specified bacterial agents as the cause of diseases classified elsewhere: Secondary | ICD-10-CM | POA: Diagnosis not present

## 2022-06-25 MED ORDER — AMOXICILLIN-POT CLAVULANATE 875-125 MG PO TABS: 1.0000 | ORAL_TABLET | Freq: Two times a day (BID) | ORAL | 0 refills | Status: DC

## 2022-06-25 NOTE — Progress Notes (Signed)

## 2022-07-15 ENCOUNTER — Other Ambulatory Visit: Payer: Self-pay | Admitting: Internal Medicine

## 2022-07-15 DIAGNOSIS — J309 Allergic rhinitis, unspecified: Secondary | ICD-10-CM

## 2022-07-31 ENCOUNTER — Encounter: Payer: Self-pay | Admitting: Internal Medicine

## 2022-08-01 ENCOUNTER — Telehealth: Payer: Self-pay | Admitting: Internal Medicine

## 2022-08-01 NOTE — Telephone Encounter (Signed)
Appt scheduled with Dr Doren Custard Friday, 08/03/2022 forms to be completed.

## 2022-08-01 NOTE — Telephone Encounter (Signed)
Disability forms (appt 08/03/2022 at 8:20 am with Dr Doren Custard)   Copioed Noted Sleeved

## 2022-08-03 ENCOUNTER — Encounter: Payer: Self-pay | Admitting: Internal Medicine

## 2022-08-03 ENCOUNTER — Ambulatory Visit (INDEPENDENT_AMBULATORY_CARE_PROVIDER_SITE_OTHER): Payer: Medicaid Other | Admitting: Internal Medicine

## 2022-08-03 VITALS — BP 115/71 | HR 68 | Ht 74.0 in | Wt 200.0 lb

## 2022-08-03 DIAGNOSIS — Z2821 Immunization not carried out because of patient refusal: Secondary | ICD-10-CM | POA: Diagnosis not present

## 2022-08-03 DIAGNOSIS — M545 Low back pain, unspecified: Secondary | ICD-10-CM | POA: Diagnosis not present

## 2022-08-03 DIAGNOSIS — Z1211 Encounter for screening for malignant neoplasm of colon: Secondary | ICD-10-CM | POA: Diagnosis not present

## 2022-08-03 MED ORDER — MELOXICAM 7.5 MG PO TABS: 7.5000 mg | ORAL_TABLET | Freq: Every day | ORAL | 0 refills | Status: AC

## 2022-08-03 NOTE — Progress Notes (Signed)
   Acute Office Visit  Subjective:     Patient ID: Benjamin Waters, male    DOB: Sep 28, 1975, 46 y.o.   MRN: [id number]  Chief Complaint  Patient presents with   Back Pain    Back pain since 07/27/2022.   Mr. Shipes presents for an acute visit today to discuss lumbar back pain.  He reports injuring his back while at work on 12/15.  He currently endorses pain in the right lumbar region.  Pain is worse with forward flexion at the waist, twisting motions, and lateral movements.  He additionally endorses shooting pains in his right leg.  He denies red flag symptoms of fever/chills, unintentional weight loss, saddle anesthesia, bowel/bladder incontinence, and weakness in his legs.  He has been managing his pain with Tylenol and home stretching exercises.  Mr. 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  He is requesting work accommodations and has paperwork for me to complete today.  Review of Systems  Constitutional:  Negative for chills, fever and weight loss.  Musculoskeletal:  Positive for back pain (Right lumbar).  Neurological:  Negative for weakness.  All other systems reviewed and are negative.       Objective:    BP 115/71   Pulse 68   Ht '6\' 2"'$  (1.88 m)   Wt 200 lb (90.7 kg)   SpO2 98%   BMI 25.68 kg/m    Physical Exam Vitals reviewed.  Musculoskeletal:        General: Tenderness (Tenderness palpation of the right paraspinal muscles of the lumbar region) present.     Comments: ROM at the waist is limited in all directions secondary to pain.  Negative SLR bilaterally.  5/5 strength in lower extremities.       Assessment & Plan:   Problem List Items Addressed This Visit       Acute right-sided low back pain without sciatica    Presenting today for evaluation of right-sided lumbar back pain that has been present for 1 week.  There is tenderness palpation paraspinal  muscles in the right lumbar region.  ROM at the waist is limited in all directions secondary to pain.  No red flag symptoms are identified. -I have prescribed meloxicam 7.5 mg to be taken daily for the next 2 weeks.  He can use on an as-needed basis afterwards.  He also has Zanaflex for as needed use. -I have also provided him with home PT exercises to complete and placed a referral to formal physical therapy.  We reviewed that this will help in preventing future injuries. -I have completed paperwork for work commendations today.  I anticipate that his pain will gradually improve over the next month with the treatment measures described above.  Given the nature of his job, it is reasonable for him to receive accommodations while he is recovering.      Meds ordered this encounter  Medications   meloxicam (MOBIC) 7.5 MG tablet    Sig: Take 1 tablet (7.5 mg total) by mouth daily for 30 doses.    Dispense:  30 tablet    Refill:  0    Return if symptoms worsen or fail to improve.  Johnette Abraham, MD

## 2022-08-03 NOTE — Patient Instructions (Signed)
It was a pleasure to see you today.  Thank you for giving [place] the opportunity to be involved in your care.  Below is a brief recap of your visit and next steps.  We will plan to see you again in May  Summary I have prescribed Mobic to be taken daily for the next 2 weeks. Referral placed for physical therapy Please see the attached home exercises I will complete work accommodations for the next month.

## 2022-08-03 NOTE — Assessment & Plan Note (Signed)
Presenting today for evaluation of right-sided lumbar back pain that has been present for 1 week.  There is tenderness palpation paraspinal muscles in the right lumbar region.  ROM at the waist is limited in all directions secondary to pain.  No red flag symptoms are identified. -I have prescribed meloxicam 7.5 mg to be taken daily for the next 2 weeks.  He can use on an as-needed basis afterwards.  He also has Zanaflex for as needed use. -I have also provided him with home PT exercises to complete and placed a referral to formal physical therapy.  We reviewed that this will help in preventing future injuries. -I have completed paperwork for work commendations today.  I anticipate that his pain will gradually improve over the next month with the treatment measures described above.  Given the nature of his job, it is reasonable for him to receive accommodations while he is recovering.

## 2022-08-22 LAB — COLOGUARD: Cologuard: NEGATIVE

## 2022-08-28 ENCOUNTER — Ambulatory Visit (HOSPITAL_COMMUNITY): Payer: Medicaid Other | Attending: Internal Medicine

## 2022-08-28 ENCOUNTER — Other Ambulatory Visit: Payer: Self-pay

## 2022-08-28 DIAGNOSIS — M545 Low back pain, unspecified: Secondary | ICD-10-CM | POA: Diagnosis not present

## 2022-08-28 NOTE — Therapy (Signed)
OUTPATIENT PHYSICAL THERAPY THORACOLUMBAR EVALUATION   Patient Name: Benjamin Waters MRN: [id number] DOB:08/28/1975, 47 y.o., male Today's Date: 08/28/2022  END OF SESSION:  PT End of Session - 08/28/22 1115     Visit Number 1    Number of Visits 8    Date for PT Re-Evaluation 09/25/22    Authorization Type  Medicaid [employer]; please check auth    Authorization - Visit Number 0    PT Start Time 2876    PT Stop Time 1155    PT Time Calculation (min) 41 min    Activity Tolerance Patient tolerated treatment well    Behavior During Therapy WFL for tasks assessed/performed             Past Medical History:  Diagnosis Date   Anxiety    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   MS (multiple sclerosis) ([place]) 11/29/2021   Chronic intractable headache 11/29/2021   Tinnitus 08/01/2021   GAD (generalized anxiety disorder) 05/31/2021   Allergic sinusitis 05/31/2021   Chronic idiopathic constipation 05/31/2021   History of CVA (cerebrovascular accident) 05/31/2021   Left shoulder pain 08/13/2001    PCP: Ihor Dow, MD  REFERRING PROVIDER: Johnette Abraham, [facility]  REFERRING DIAG: M54.50 (ICD-10-CM) - Acute right-sided low back pain without sciatica  Rationale for Evaluation and Treatment: Rehabilitation  THERAPY DIAG:  Low back pain, unspecified back pain laterality, unspecified chronicity, unspecified whether sciatica present  ONSET DATE: chronic but latest flare mid December.  SUBJECTIVE:                                                                                                                                                                                            SUBJECTIVE STATEMENT: History of injuring right lower side of the back previously; went back to work after working from home for a year and half.  Works at [employer].  Moving items in the cooler. Next day was hurting in right side of low back and around into groin.  Saw the MD.  Put on meloxicam and referred for therapy.  Still working although not at [employer].  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  Has a 91 yo son who is autistic and he weighs 50#'s; states MD wanted him to limit what he picked up  to 24#.  PERTINENT HISTORY:  Chronic back pain over the years  PAIN:  Are you having pain? Yes: NPRS scale: 5/10 Pain location: right side low back and right hip Pain description: aching, sore Aggravating factors: bending, lifting heavy,  Relieving factors: meloxicam, heat and stretching  PRECAUTIONS: None  WEIGHT BEARING RESTRICTIONS: No  FALLS:  Has patient fallen in last 6 months? No  LIVING ENVIRONMENT: Lives with: lives with their family Lives in: House/apartment Stairs: Yes: Internal: 16 steps; on left going up and External: 3-4 steps; on left going up Has following equipment at home: None  OCCUPATION: works at [employer]: learn how to take care of my back  NEXT MD VISIT: no set follow up  OBJECTIVE:   DIAGNOSTIC FINDINGS:  No x-rays taken  PATIENT SURVEYS:  Modified Oswestry 18/50 36% disability    COGNITION: Overall cognitive status: Within functional limits for tasks assessed     SENSATION: WFL N/T in feet and hands to due Multiple Schlerosis   POSTURE: rounded shoulders and forward head  PALPATION: Tightness noted thoracic paraspinals > lumbar paraspinals  LUMBAR ROM:   AROM eval  Flexion full  Extension 75% available*  Right lateral flexion "Pinch" to knee  Left lateral flexion To about 3" past knee  Right rotation   Left rotation    (Blank  rows = not tested)   LOWER EXTREMITY MMT:    MMT Right eval Left eval  Hip flexion 4* 5  Hip extension 4-* 4+  Hip abduction    Hip adduction    Hip internal rotation    Hip external rotation    Knee flexion 4 4+  Knee extension 4+ 5  Ankle dorsiflexion 5 5  Ankle plantarflexion    Ankle inversion    Ankle eversion     (Blank rows = not tested)  FUNCTIONAL TESTS:  5 times sit to stand: 19.92 sec  GAIT: Distance walked: 40 Assistive device utilized: None Level of assistance: Modified independence Comments: slight antalgic gait  TODAY'S TREATMENT:                                                                                                                              DATE:  08/28/22 physical therapy evaluation and treatment    PATIENT EDUCATION:  Education details: Patient educated on exam findings, POC, scope of PT, HEP, and good sitting posture. Person educated: Patient Education method: Explanation, Demonstration, and Handouts Education comprehension: verbalized understanding, returned demonstration, verbal cues required, and tactile cues required  HOME EXERCISE PROGRAM: Access Code: [email or web address] URL: [email or web address] Date: 08/28/2022 Prepared by: AP - Rehab  Exercises - Supine Bridge  - 1 x daily - 7 x weekly - 3 sets - 10 reps - Supine Transversus Abdominis Bracing - Hands on Stomach  - 1 x daily - 7 x weekly - 3 sets - 10 reps  ASSESSMENT:  CLINICAL IMPRESSION: Patient is a 47  y.o. male who was seen today for physical therapy evaluation and treatment for low back pain.  M54.50 (ICD-10-CM) - Acute right-sided low back pain without sciatica. Patient  presents to physical therapy with complaint of right side low back pain. Patient demonstrates muscle weakness, reduced ROM, and fascial restrictions which are likely contributing to symptoms of pain and are negatively impacting patient ability to perform ADLs and functional mobility tasks. Patient  will benefit from skilled physical therapy services to address these deficits to reduce pain and improve level of function with ADLs and functional mobility tasks.   OBJECTIVE IMPAIRMENTS: Abnormal gait, decreased activity tolerance, decreased endurance, decreased mobility, difficulty walking, decreased ROM, decreased strength, decreased safety awareness, hypomobility, increased fascial restrictions, impaired perceived functional ability, increased muscle spasms, impaired flexibility, improper body mechanics, postural dysfunction, and pain.   ACTIVITY LIMITATIONS: carrying, lifting, bending, sitting, standing, squatting, sleeping, stairs, transfers, bed mobility, locomotion level, and caring for others  PARTICIPATION LIMITATIONS: meal prep, cleaning, laundry, shopping, community activity, occupation, and yard work  PERSONAL FACTORS: Past/current experiences are also affecting patient's functional outcome.   REHAB POTENTIAL: Good  CLINICAL DECISION MAKING: Stable/uncomplicated  EVALUATION COMPLEXITY: Low   GOALS: Goals reviewed with patient? No  SHORT TERM GOALS: Target date: 09/11/2022  patient will be independent with initial HEP  Baseline: Goal status: INITIAL  2.   Patient will self report 30% improvement to improve tolerance for functional activity  Baseline:  Goal status: INITIAL  LONG TERM GOALS: Target date: 09/25/2022  Patient will be independent in self management strategies to improve quality of life and functional outcomes.  Baseline:  Goal status: INITIAL  2.    Patient will self report 50% improvement to improve tolerance for functional activity  Baseline:  Goal status: INITIAL  3.  Patient will increase leg MMTs to 5/5 without pain to promote return to ambulation community distances with minimal deviation.  Baseline: see above Goal status: INITIAL  4.  Patient will improve Modified Oswestry score to 13/50 or less to demonstrate improved functional  mobiltiy Baseline: 18/50 Goal status: INITIAL   PLAN:  PT FREQUENCY: 2x/week  PT DURATION: 4 weeks  PLANNED INTERVENTIONS: Therapeutic exercises, Therapeutic activity, Neuromuscular re-education, Balance training, Gait training, Patient/Family education, Joint manipulation, Joint mobilization, Stair training, Orthotic/Fit training, DME instructions, Aquatic Therapy, Dry Needling, Electrical stimulation, Spinal manipulation, Spinal mobilization, Cryotherapy, Moist heat, Compression bandaging, scar mobilization, Splintting, Taping, Traction, Ultrasound, Ionotophoresis '4mg'$ /ml Dexamethasone, and Manual therapy .  PLAN FOR NEXT SESSION: Review HEP and goals   12:05 PM, 08/28/22 Markel Mergenthaler Small Kayshawn Ozburn MPT [facility]  [phone]

## 2022-08-29 ENCOUNTER — Other Ambulatory Visit: Payer: Self-pay | Admitting: Internal Medicine

## 2022-08-29 ENCOUNTER — Encounter: Payer: Self-pay | Admitting: Neurology

## 2022-08-29 DIAGNOSIS — F411 Generalized anxiety disorder: Secondary | ICD-10-CM

## 2022-09-01 LAB — COLOGUARD: COLOGUARD: NEGATIVE

## 2022-09-12 ENCOUNTER — Ambulatory Visit (HOSPITAL_COMMUNITY): Payer: Medicaid Other | Admitting: Physical Therapy

## 2022-09-12 DIAGNOSIS — M545 Low back pain, unspecified: Secondary | ICD-10-CM | POA: Diagnosis not present

## 2022-09-12 NOTE — Therapy (Signed)
OUTPATIENT PHYSICAL THERAPY TREATMENT   Patient Name: Benjamin Waters MRN: [id number] DOB:[birth year], 47 y.o., male Today's Date: 09/12/2022  END OF SESSION:  PT End of Session - 09/12/22 1355     Visit Number 2    Number of Visits 8    Date for PT Re-Evaluation 09/25/22    Authorization Type UHC no auth required over 21, 30 visit limit combined therapies  [place] Medicaid [employer]; please check auth    Authorization - Visit Number 1    Authorization - Number of Visits 30    Progress Note Due on Visit 8    PT Start Time 1350    PT Stop Time 1430    PT Time Calculation (min) 40 min    Activity Tolerance Patient tolerated treatment well    Behavior During Therapy WFL for tasks assessed/performed             Past Medical History:  Diagnosis Date   Anxiety    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   MS (multiple sclerosis) ([place]) 11/29/2021   Chronic intractable headache 11/29/2021   Tinnitus 08/01/2021   GAD (generalized anxiety disorder) 05/31/2021   Allergic sinusitis 05/31/2021   Chronic idiopathic constipation 05/31/2021   History of CVA (cerebrovascular accident) 05/31/2021   Left shoulder pain 08/13/2001    PCP: Ihor Dow, MD  REFERRING PROVIDER: Johnette Abraham, MD [facility]  REFERRING DIAG: M54.50 (ICD-10-CM) - Acute right-sided low back pain without sciatica  Rationale for Evaluation and Treatment: Rehabilitation  THERAPY DIAG:  Low back pain, unspecified back pain laterality, unspecified chronicity, unspecified whether sciatica present  ONSET DATE: chronic but latest flare mid December.  SUBJECTIVE:                                                                                                                                                                                            SUBJECTIVE STATEMENT: Pt states he is doing well today; central low back pain and some pain down both of his legs. States he knows is posture is bad and probably contributing to pain.  Reports compliance with given exercises at last treatment  Evaluation:  History of injuring right lower side of the back previously; went back to work after working from home for a year and half.  Works at [employer].  Moving items in the cooler. Next day was hurting in right side of low back and around into groin.  Saw the MD.  Put on meloxicam and referred for therapy.  Still working although not at [employer].  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  PERTINENT HISTORY:  Chronic back pain over the years  PAIN:  Are you having pain? Yes: NPRS scale: 4/10 Pain location: right side low back and right hip Pain description: aching, sore Aggravating factors: bending, lifting heavy,  Relieving factors: meloxicam, heat and stretching  PRECAUTIONS: None  WEIGHT BEARING RESTRICTIONS: No  FALLS:  Has patient fallen in last 6 months? No  LIVING ENVIRONMENT: Lives with: lives with their family Lives in: House/apartment Stairs: Yes: Internal: 16 steps; on left going up and External: 3-4 steps; on left going up Has following equipment at home: None  OCCUPATION: works at [employer]: learn how to take care of my back  NEXT MD VISIT: no set follow up  OBJECTIVE:   DIAGNOSTIC FINDINGS:  No x-rays taken  PATIENT SURVEYS:  Modified Oswestry 18/50 36% disability    COGNITION: Overall cognitive status: Within functional limits for tasks assessed     SENSATION: WFL N/T in feet  and hands to due Multiple Schlerosis   POSTURE: rounded shoulders and forward head  PALPATION: Tightness noted thoracic paraspinals > lumbar paraspinals  LUMBAR ROM:   AROM eval  Flexion full  Extension 75% available*  Right lateral flexion "Pinch" to knee  Left lateral flexion To about 3" past knee  Right rotation   Left rotation    (Blank rows = not tested)   LOWER EXTREMITY MMT:    MMT Right eval Left eval  Hip flexion 4* 5  Hip extension 4-* 4+  Hip abduction    Hip adduction    Hip internal rotation    Hip external rotation    Knee flexion 4 4+  Knee extension 4+ 5  Ankle dorsiflexion 5 5  Ankle plantarflexion    Ankle inversion    Ankle eversion     (Blank rows = not tested)  FUNCTIONAL TESTS:  5 times sit to stand: 19.92 sec  GAIT: Distance walked: 40 Assistive device utilized: None Level of assistance: Modified independence Comments: slight antalgic gait  TODAY'S TREATMENT:                                                                                                                              DATE:  09/12/22 Goal review/HEP review Supine:  bridge 10X  Trans. Abdominal bracing 10X5" Standing: Scap retraction GTB 10X  Rows GTB 10X  Shoulder extension 10X  Paloff GTB 10X each direction  Corner pec stretch 3X30" Sit to stand no UE assist standard chair 10X  08/28/22 physical therapy evaluation and treatment    PATIENT EDUCATION:  Education details: Patient educated on exam findings, POC, scope of PT, HEP, and good sitting posture. Person educated: Patient Education method: Explanation, Demonstration, and Handouts Education comprehension: verbalized understanding, returned demonstration, verbal cues required, and tactile cues required  HOME EXERCISE PROGRAM: Access Code: [email or web address] URL: [email or web address] Date: 08/28/2022 Prepared by: AP - Rehab Exercises - Supine Bridge  - 1 x daily - 7 x weekly - 3 sets - 10 reps -  Supine Transversus Abdominis Bracing - Hands on Stomach  - 1 x daily - 7 x weekly - 3 sets - 10 reps  Date: 09/12/2022 Prepared by: Roseanne Reno Exercises - Supine Bridge  - 1 x daily - 7 x weekly - 3 sets - 10 reps - Supine Transversus Abdominis Bracing - Hands on Stomach  - 1 x daily - 7 x weekly - 3 sets - 10 reps - 5 seconds hold - Standing Anti-Rotation Press with Anchored Resistance  - 2 x daily - 7 x weekly - 2 sets - 10 reps - Standing Row with Resistance with Anchored Resistance at Chest Height Palms Down  - 2 x daily - 7 x weekly - 2 sets - 10 reps - Standing Bilateral Low Shoulder Row with Anchored Resistance  - 2 x daily - 7 x weekly - 2 sets - 10 reps - Single Arm Shoulder Extension with Anchored Resistance  - 2 x daily - 7 x weekly - 2 sets - 10 reps - Corner Pec Major Stretch  - 3 x daily - 7 x weekly - 2 sets - 3 reps - 30 seconds hold - Doorway Pec Stretch at 90 Degrees Abduction  - 2 x daily - 7 x weekly - 2 sets - 3 reps - 30 seconds hold - Sit to Stand Without Arm Support  - 2 x daily - 7 x weekly - 2 sets - 10 reps  ASSESSMENT:  CLINICAL IMPRESSION: Reviewed goals and POC moving forward.  Patient able to recall and complete established HEP correctly with questions on hold times for both exercises.  Progressed with addition of postural strengthening using theraband, pec stretches and further core strengthening.  Verbal and tactile cues needed to maintain correct posture/form with exercises.  Updated HEP to included added exercises this session.  Patient will continue to benefit from skilled physical therapy services to address deficits, reduce pain and improve level of function with ADLs and functional mobility tasks.   OBJECTIVE IMPAIRMENTS: Abnormal gait, decreased activity tolerance, decreased endurance, decreased mobility, difficulty walking, decreased ROM, decreased strength, decreased safety awareness, hypomobility, increased fascial restrictions, impaired perceived  functional ability, increased muscle spasms, impaired flexibility, improper body mechanics, postural dysfunction, and pain.   ACTIVITY LIMITATIONS: carrying, lifting, bending, sitting, standing, squatting, sleeping, stairs, transfers, bed mobility, locomotion level, and caring for others  PARTICIPATION LIMITATIONS: meal prep, cleaning, laundry, shopping, community activity, occupation, and yard work  PERSONAL FACTORS: Past/current experiences are also affecting patient's functional outcome.   REHAB POTENTIAL: Good  CLINICAL DECISION MAKING: Stable/uncomplicated  EVALUATION COMPLEXITY: Low   GOALS: Goals reviewed with patient? No  SHORT TERM GOALS: Target date: 09/11/2022  patient will be independent with initial HEP  Baseline: Goal status: IN PROGRESS  2.   Patient will self report 30% improvement to improve tolerance for functional activity  Baseline:  Goal status: IN PROGRESS  LONG TERM GOALS: Target date: 09/25/2022  Patient will be independent in self management strategies to improve quality  of life and functional outcomes.  Baseline:  Goal status: IN PROGRESS  2.    Patient will self report 50% improvement to improve tolerance for functional activity  Baseline:  Goal status: IN PROGRESS  3.  Patient will increase leg MMTs to 5/5 without pain to promote return to ambulation community distances with minimal deviation.  Baseline: see above Goal status: IN PROGRESS  4.  Patient will improve Modified Oswestry score to 13/50 or less to demonstrate improved functional mobiltiy Baseline: 18/50 Goal status: IN PROGRESS   PLAN:  PT FREQUENCY: 2x/week  PT DURATION: 4 weeks  PLANNED INTERVENTIONS: Therapeutic exercises, Therapeutic activity, Neuromuscular re-education, Balance training, Gait training, Patient/Family education, Joint manipulation, Joint mobilization, Stair training, Orthotic/Fit training, DME instructions, Aquatic Therapy, Dry Needling, Electrical  stimulation, Spinal manipulation, Spinal mobilization, Cryotherapy, Moist heat, Compression bandaging, scar mobilization, Splintting, Taping, Traction, Ultrasound, Ionotophoresis '4mg'$ /ml Dexamethasone, and Manual therapy  PLAN FOR NEXT SESSION: continue to progress core and postural strength while improving overall mobility and function.    1:57 PM, 09/12/22 Teena Irani, PTA/CLT [place] Ph: [phone]

## 2022-09-17 ENCOUNTER — Ambulatory Visit (HOSPITAL_COMMUNITY): Payer: Medicaid Other | Attending: Internal Medicine | Admitting: Physical Therapy

## 2022-09-17 DIAGNOSIS — M545 Low back pain, unspecified: Secondary | ICD-10-CM | POA: Diagnosis present

## 2022-09-17 NOTE — Therapy (Signed)
OUTPATIENT PHYSICAL THERAPY TREATMENT   Patient Name: Benjamin Waters MRN: [id number] DOB:1976/06/03, 47 y.o., male Today's Date: 09/17/2022  END OF SESSION:  PT End of Session - 09/17/22 0946     Visit Number 3    Number of Visits 8    Date for PT Re-Evaluation 09/25/22    Authorization Type UHC no auth required over 21, 30 visit limit combined therapies  [place] Medicaid [employer]; please check auth    Authorization - Number of Visits 30    Progress Note Due on Visit 8    PT Start Time [phone]    PT Stop Time 1026    PT Time Calculation (min) 39 min    Activity Tolerance Patient tolerated treatment well    Behavior During Therapy WFL for tasks assessed/performed             Past Medical History:  Diagnosis Date   Anxiety    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   MS (multiple sclerosis) ([place]) 11/29/2021   Chronic intractable headache 11/29/2021   Tinnitus 08/01/2021   GAD (generalized anxiety disorder) 05/31/2021   Allergic sinusitis 05/31/2021   Chronic idiopathic constipation 05/31/2021   History of CVA (cerebrovascular accident) 05/31/2021   Left shoulder pain 08/13/2001    PCP: Ihor Dow, MD  REFERRING PROVIDER: Johnette Abraham, MD [facility]  REFERRING DIAG: M54.50 (ICD-10-CM) - Acute right-sided low back pain without sciatica  Rationale for Evaluation and Treatment: Rehabilitation  THERAPY DIAG:  Low back pain, unspecified back pain laterality, unspecified chronicity, unspecified whether sciatica present  ONSET DATE: chronic but latest flare mid December.  SUBJECTIVE:                                                                                                                                                                                            SUBJECTIVE STATEMENT: Patient states "it could be worse". No issues with HEP. Feels his back may be a little better.    Evaluation:  History of injuring right lower side of the back previously; went back to work after working from home for a year and half.  Works at [employer].  Moving items in the cooler. Next day was hurting in right side of low back and around into groin.  Saw the MD.  Put on meloxicam and  referred for therapy.  Still working although not at [employer].  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  Has a 19 yo son who is autistic and he weighs 50#'s; states MD wanted him to limit what he picked up to 24#.  PERTINENT HISTORY:  Chronic back pain over the years  PAIN:  Are you having pain? Yes: NPRS scale: 4/10 Pain location: right side low back and right hip Pain description: aching, sore Aggravating factors: bending, lifting heavy,  Relieving factors: meloxicam, heat and stretching  PRECAUTIONS: None  WEIGHT BEARING RESTRICTIONS: No  FALLS:  Has patient fallen in last 6 months? No  LIVING ENVIRONMENT: Lives with: lives with their family Lives in: House/apartment Stairs: Yes: Internal: 16 steps; on left going up and External: 3-4 steps; on left going up Has following equipment at home: None  OCCUPATION: works at [employer]: learn how to take care of my back  NEXT MD VISIT: no set follow up  OBJECTIVE:   DIAGNOSTIC FINDINGS:  No x-rays taken  PATIENT SURVEYS:  Modified Oswestry 18/50 36% disability    COGNITION: Overall cognitive status: Within functional limits for tasks assessed     SENSATION: WFL N/T in feet and hands to due Multiple Schlerosis   POSTURE: rounded shoulders and forward head  PALPATION: Tightness noted thoracic paraspinals > lumbar  paraspinals  LUMBAR ROM:   AROM eval  Flexion full  Extension 75% available*  Right lateral flexion "Pinch" to knee  Left lateral flexion To about 3" past knee  Right rotation   Left rotation    (Blank rows = not tested)   LOWER EXTREMITY MMT:    MMT Right eval Left eval  Hip flexion 4* 5  Hip extension 4-* 4+  Hip abduction    Hip adduction    Hip internal rotation    Hip external rotation    Knee flexion 4 4+  Knee extension 4+ 5  Ankle dorsiflexion 5 5  Ankle plantarflexion    Ankle inversion    Ankle eversion     (Blank rows = not tested)  FUNCTIONAL TESTS:  5 times sit to stand: 19.92 sec  GAIT: Distance walked: 40 Assistive device utilized: None Level of assistance: Modified independence Comments: slight antalgic gait  TODAY'S TREATMENT:                                                                                                                              DATE:  09/17/22 Bridge 3 x 10 SLR with ab brace 3 x 10 Deadbug 2 x 10 (knees bent) Sideplank (from knees) 3 x 45" each  Supine piriformis stretch 3 x 30"   Band Row BTB 2 x 10 Shoulder extension BTB 2 x 10  Shoulder ER BTB 20 x 5"    09/12/22 Goal review/HEP review Supine:  bridge 10X  Trans. Abdominal bracing 10X5" Standing: Scap retraction GTB 10X  Rows GTB 10X  Shoulder extension 10X  Paloff GTB 10X each direction  Corner pec stretch 3X30" Sit to stand no UE assist standard chair 10X   08/28/22 physical therapy evaluation and treatment    PATIENT EDUCATION:  Education details: Patient educated on exam findings, POC, scope of PT, HEP, and good sitting posture. Person educated: Patient Education method: Explanation, Demonstration, and Handouts Education comprehension: verbalized understanding, returned demonstration, verbal cues required, and tactile cues required  HOME EXERCISE PROGRAM: Access Code: [email or web address] URL: [email or web address]  09/17/22 - Shoulder  External Rotation and Scapular Retraction with Resistance  - 2 x daily - 7 x weekly - 3 sets - 10 reps - 5 second hold - Supine Active Straight Leg Raise  - 2 x daily - 7 x weekly - 3 sets - 10 reps - Dead Bug  - 2 x daily - 7 x weekly - 3 sets - 10 reps - Supine Piriformis Stretch with Foot on Ground  - 2 x daily - 7 x weekly - 1 sets - 3 reps  Date: 08/28/2022 Prepared by: [facility] - Rehab Exercises - Supine Bridge  - 1 x daily - 7 x weekly - 3 sets - 10 reps - Supine Transversus Abdominis Bracing - Hands on Stomach  - 1 x daily - 7 x weekly - 3 sets - 10 reps  Date: 09/12/2022 Prepared by: Roseanne Reno Exercises - Supine Bridge  - 1 x daily - 7 x weekly - 3 sets - 10 reps - Supine Transversus Abdominis Bracing - Hands on Stomach  - 1 x daily - 7 x weekly - 3 sets - 10 reps - 5 seconds hold - Standing Anti-Rotation Press with Anchored Resistance  - 2 x daily - 7 x weekly - 2 sets - 10 reps - Standing Row with Resistance with Anchored Resistance at Chest Height Palms Down  - 2 x daily - 7 x weekly - 2 sets - 10 reps - Standing Bilateral Low Shoulder Row with Anchored Resistance  - 2 x daily - 7 x weekly - 2 sets - 10 reps - Single Arm Shoulder Extension with Anchored Resistance  - 2 x daily - 7 x weekly - 2 sets - 10 reps - Corner Pec Major Stretch  - 3 x daily - 7 x weekly - 2 sets - 3 reps - 30 seconds hold - Doorway Pec Stretch at 90 Degrees Abduction  - 2 x daily - 7 x weekly - 2 sets - 3 reps - 30 seconds hold - Sit to Stand Without Arm Support  - 2 x daily - 7 x weekly - 2 sets - 10 reps  ASSESSMENT:  CLINICAL IMPRESSION: Patient tolerated session well today. Progressed core and scapular stabilization exercise for improving posture and decreased back pain. Patient educated on purpose and function of all added exercises. Patient requires verbal cues for hold times of stretches and pacing strengthening exercise to avoid performing activity too fast. Updated HEP and issued handout. Patient  will continue to benefit from skilled therapy services to reduce remaining deficits and improve functional ability.    OBJECTIVE IMPAIRMENTS: Abnormal gait, decreased activity tolerance, decreased endurance, decreased mobility, difficulty walking, decreased ROM, decreased strength, decreased safety awareness, hypomobility, increased fascial restrictions, impaired perceived functional ability, increased muscle spasms, impaired flexibility, improper body mechanics, postural dysfunction, and pain.   ACTIVITY LIMITATIONS: carrying, lifting, bending, sitting, standing, squatting, sleeping, stairs, transfers, bed mobility, locomotion level, and caring for others  PARTICIPATION LIMITATIONS: meal prep,  cleaning, laundry, shopping, community activity, occupation, and yard work  PERSONAL FACTORS: Past/current experiences are also affecting patient's functional outcome.   REHAB POTENTIAL: Good  CLINICAL DECISION MAKING: Stable/uncomplicated  EVALUATION COMPLEXITY: Low   GOALS: Goals reviewed with patient? No  SHORT TERM GOALS: Target date: 09/11/2022  patient will be independent with initial HEP  Baseline: Goal status: IN PROGRESS  2.   Patient will self report 30% improvement to improve tolerance for functional activity  Baseline:  Goal status: IN PROGRESS  LONG TERM GOALS: Target date: 09/25/2022  Patient will be independent in self management strategies to improve quality of life and functional outcomes.  Baseline:  Goal status: IN PROGRESS  2.    Patient will self report 50% improvement to improve tolerance for functional activity  Baseline:  Goal status: IN PROGRESS  3.  Patient will increase leg MMTs to 5/5 without pain to promote return to ambulation community distances with minimal deviation.  Baseline: see above Goal status: IN PROGRESS  4.  Patient will improve Modified Oswestry score to 13/50 or less to demonstrate improved functional mobiltiy Baseline: 18/50 Goal  status: IN PROGRESS   PLAN:  PT FREQUENCY: 2x/week  PT DURATION: 4 weeks  PLANNED INTERVENTIONS: Therapeutic exercises, Therapeutic activity, Neuromuscular re-education, Balance training, Gait training, Patient/Family education, Joint manipulation, Joint mobilization, Stair training, Orthotic/Fit training, DME instructions, Aquatic Therapy, Dry Needling, Electrical stimulation, Spinal manipulation, Spinal mobilization, Cryotherapy, Moist heat, Compression bandaging, scar mobilization, Splintting, Taping, Traction, Ultrasound, Ionotophoresis '4mg'$ /ml Dexamethasone, and Manual therapy  PLAN FOR NEXT SESSION: continue to progress core and postural strength while improving overall mobility and function.   10:33 AM, 09/17/22 Josue Hector PT DPT  Physical Therapist with [facility]  [phone]

## 2022-09-18 ENCOUNTER — Encounter (HOSPITAL_COMMUNITY): Payer: Medicaid Other

## 2022-09-18 IMAGING — MR MR HEAD WO/W CM
15 series · 48 of 48 positions shown · IV contrast (19 ml multihance)
Comparison: 07/08/2021

CLINICAL DATA: White matter abnormality. Right-sided body paralysis
in 7779. Episodes of body shaking

EXAM:
MRI HEAD WITHOUT AND WITH CONTRAST
TECHNIQUE: Multiplanar, multiecho pulse sequences of the brain and surrounding
structures were obtained without and with intravenous contrast.
CONTRAST:  19mL MULTIHANCE GADOBENATE DIMEGLUMINE 529 MG/ML IV SOLN

[Series 5: T1 · sagittal · 4.0mm · 0.75mm/px · 1 of 31 slices shown (1 of 3)]
[im 1/31]
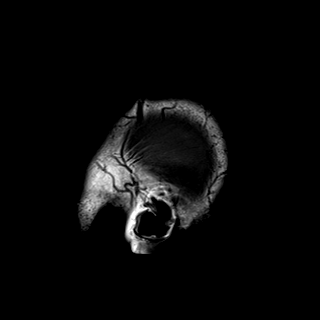

[Series 6: FLAIR · sagittal · 4.0mm · 0.72mm/px · 1 of 28 slices shown (1 of 2)]
[im 1/28]
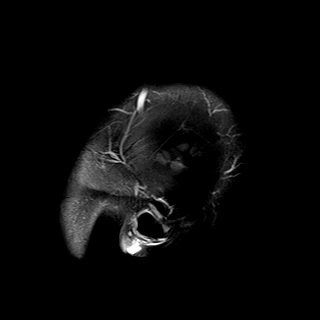

[Series 7: DWI · axial · 3.0mm · 0.94mm/px · z∈[-78,+61]mm · 8 of 160 slices shown (1 of 3)]
[im 1/160]
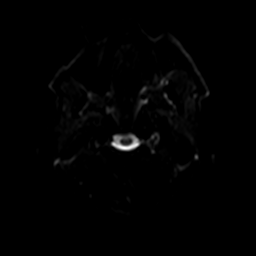
[im 23/160]
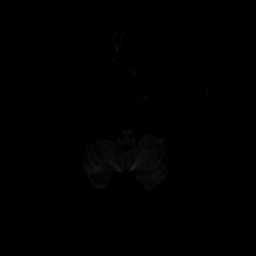
[im 46/160]
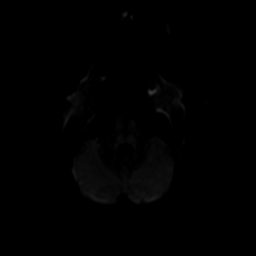
[im 69/160]
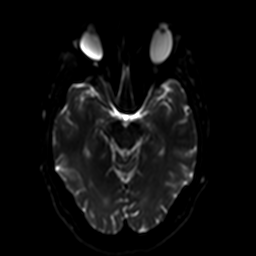
[im 91/160]
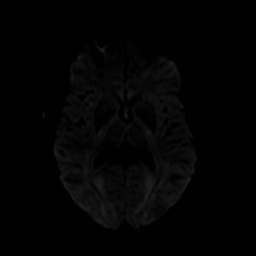
[im 114/160]
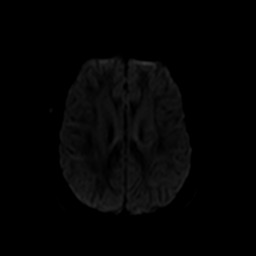
[im 137/160]
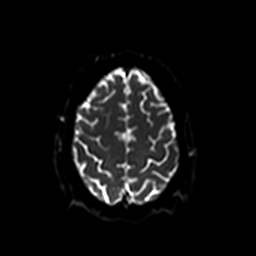
[im 160/160]
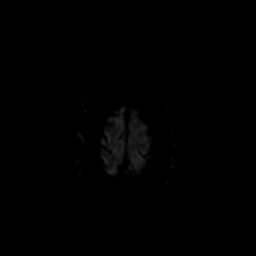

[Series 8: ax dwi_tracew · axial · 3.0mm · 0.94mm/px · z∈[-78,+61]mm · 4 of 79 slices shown]
[im 1/79]
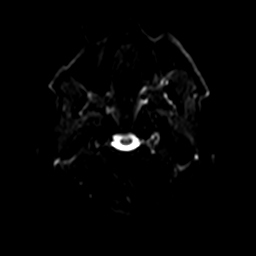
[im 27/79]
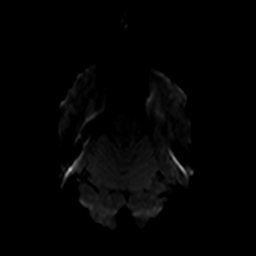
[im 53/79]
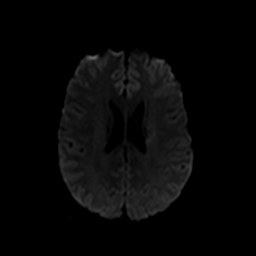
[im 79/79]
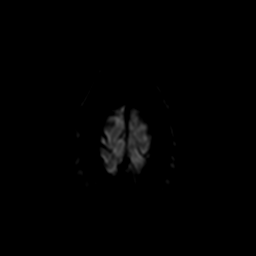

[Series 9: ax dwi_adc · axial · 3.0mm · 0.94mm/px · z∈[-78,+61]mm · 2 of 40 slices shown]
[im 1/40]
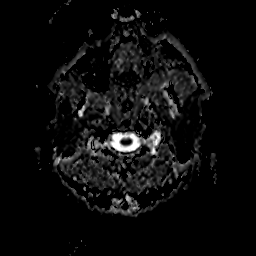
[im 40/40]
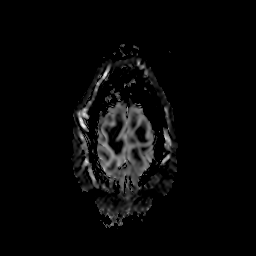

[Series 10: DWI · coronal · 5.0mm · 1.44mm/px · 3 of 60 slices shown (2 of 3)]
[im 1/60]
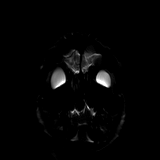
[im 30/60]
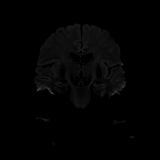
[im 60/60]
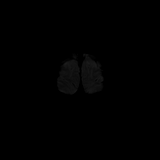

[Series 11: DWI · coronal · 5.0mm · 1.44mm/px · 1 of 30 slices shown (3 of 3)]
[im 1/30]
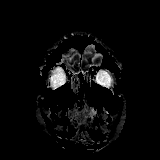

[Series 12: T2 · axial · 4.0mm · 0.36mm/px · 1 of 30 slices shown]
[im 1/30]
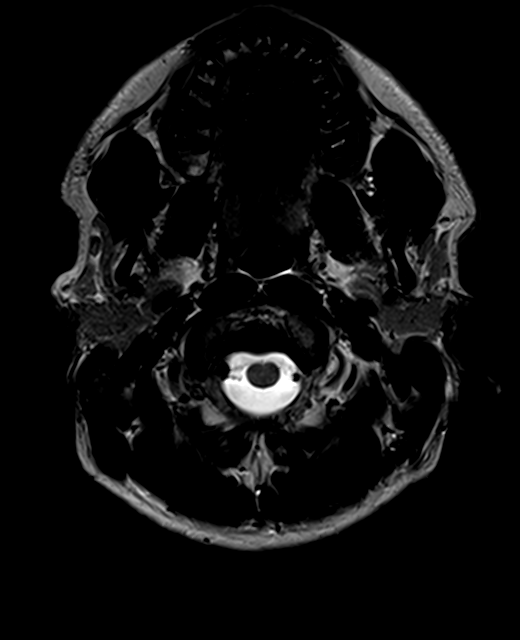

[Series 13: FLAIR · axial · 3.0mm · 0.72mm/px · 1 of 26 slices shown (2 of 2)]
[im 1/26]
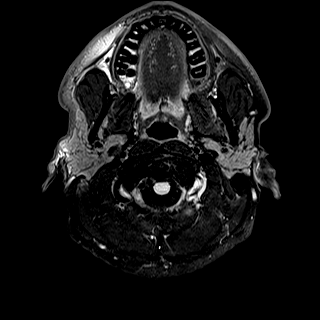

[Series 14: swi_images · axial · 1.5mm · 0.90mm/px · z∈[-75,+78]mm · 5 of 104 slices shown]
[im 1/104]
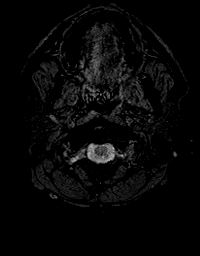
[im 26/104]
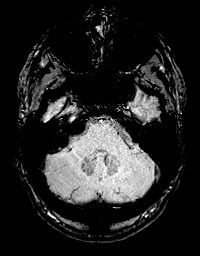
[im 52/104]
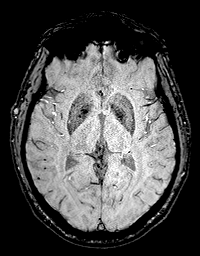
[im 78/104]
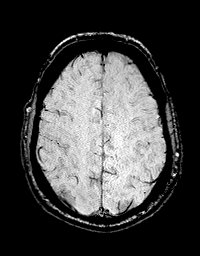
[im 104/104]
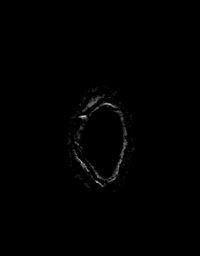

[Series 16: T1 · axial · 1.0mm · 0.94mm/px · z∈[-78,+80]mm · 8 of 160 slices shown (2 of 3)]
[im 1/160]
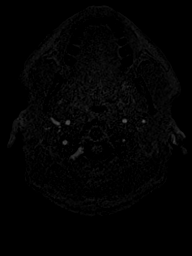
[im 23/160]
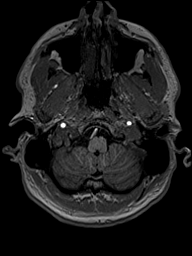
[im 46/160]
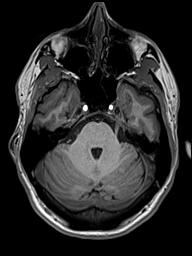
[im 69/160]
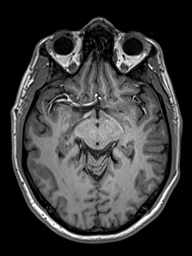
[im 91/160]
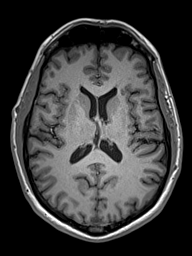
[im 114/160]
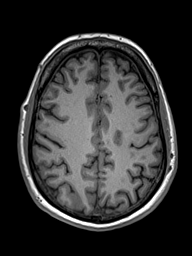
[im 137/160]
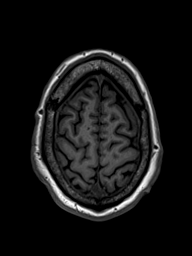
[im 160/160]
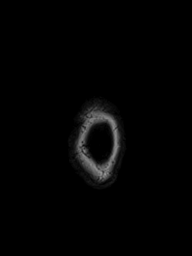

[Series 17: T2 post-contrast · coronal · 4.5mm · 0.36mm/px · 2 of 35 slices shown]
[im 1/35]
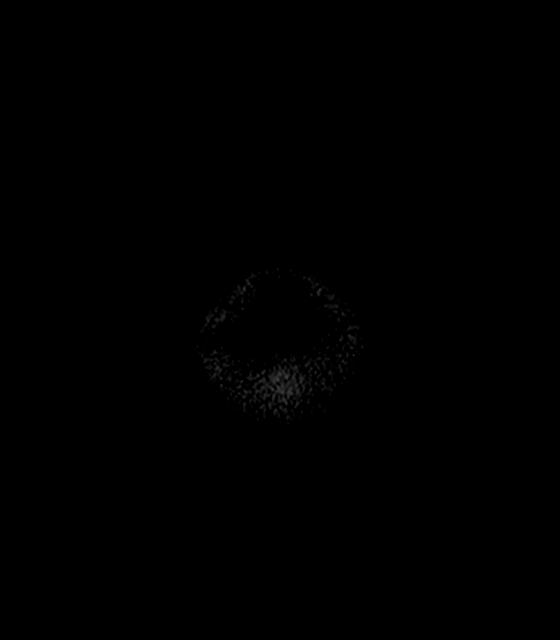
[im 35/35]
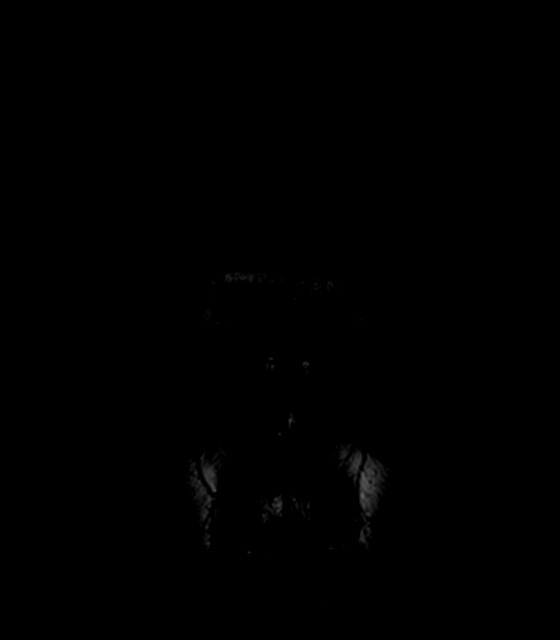

[Series 18: T1 · axial · 1.0mm · 0.94mm/px · z∈[-78,+80]mm · 8 of 160 slices shown (3 of 3)]
[im 1/160]
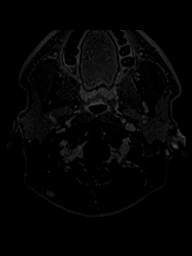
[im 23/160]
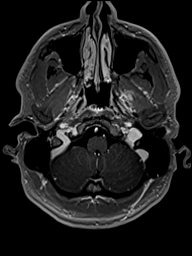
[im 46/160]
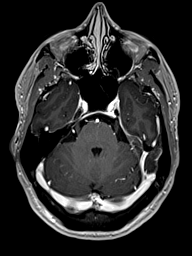
[im 69/160]
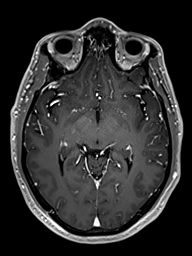
[im 91/160]
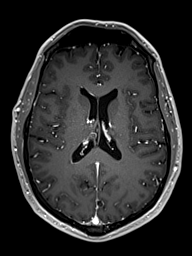
[im 114/160]
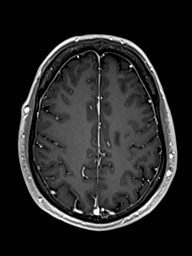
[im 137/160]
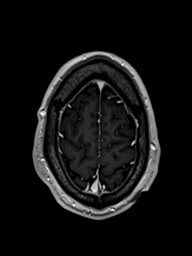
[im 160/160]
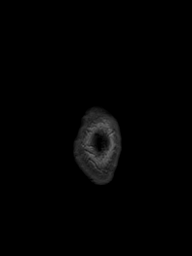

[Series 19: T1 post-contrast · coronal · 4.5mm · 0.72mm/px · 2 of 35 slices shown (1 of 2)]
[im 1/35]
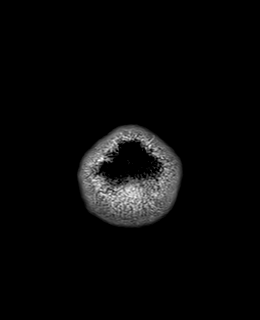
[im 35/35]
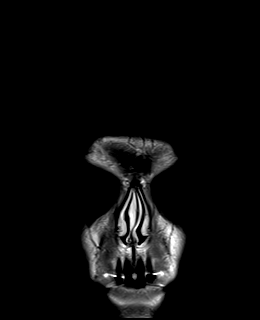

[Series 20: T1 post-contrast · sagittal · 4.0mm · 0.75mm/px · 1 of 31 slices shown (2 of 2)]
[im 1/31]
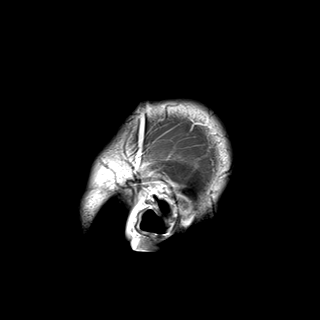

[48 of 48 positions shown; findings below may reference images not displayed]

FINDINGS: Brain: Scattered FLAIR hyperintensities in the cerebral white
matter, largest in the left periventricular region. No mass effect
at this largest lesion to imply tumor, rather there is subtle volume
loss on coronal T2 weighted imaging. Few juxta cortical signal
abnormalities are also seen. No progression, infratentorial
involvement, or abnormal enhancement. Normal brain volume with no
acute or subacute infarct, hemorrhage, hydrocephalus, or mass
effect.

Vascular: Normal flow voids and vascular enhancements. Small
developmental venous anomaly in the left occipital lobe

Skull and upper cervical spine: Normal marrow signal

Sinuses/Orbits: Negative
IMPRESSION: Unchanged extent of white matter disease with some periventricular
and juxta cortical predilection again implicating demyelinating
process. No enhancement or interval progression.

## 2022-09-20 ENCOUNTER — Encounter (HOSPITAL_COMMUNITY): Payer: Medicaid Other

## 2022-09-25 ENCOUNTER — Encounter (HOSPITAL_COMMUNITY): Payer: Medicaid Other | Admitting: Physical Therapy

## 2022-09-26 ENCOUNTER — Ambulatory Visit (HOSPITAL_COMMUNITY): Payer: Medicaid Other

## 2022-09-26 DIAGNOSIS — M545 Low back pain, unspecified: Secondary | ICD-10-CM | POA: Diagnosis not present

## 2022-09-26 NOTE — Therapy (Signed)
OUTPATIENT PHYSICAL THERAPY TREATMENT   Patient Name: Benjamin Waters MRN: [id number] DOB:[birth year], 47 y.o., male Today's Date: 09/26/2022  END OF SESSION:  PT End of Session - 09/26/22 1259     Visit Number 4    Number of Visits 8    Date for PT Re-Evaluation 09/25/22    Authorization Type UHC no auth required over 21, 30 visit limit combined therapies  [place] [employer];    Authorization - Visit Number 4    Authorization - Number of Visits 30    Progress Note Due on Visit 8    PT Start Time 1300    PT Stop Time 1340    PT Time Calculation (min) 40 min    Activity Tolerance Patient tolerated treatment well    Behavior During Therapy WFL for tasks assessed/performed             Past Medical History:  Diagnosis Date   Anxiety    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   MS (multiple sclerosis) ([place]) 11/29/2021   Chronic intractable headache 11/29/2021   Tinnitus 08/01/2021   GAD (generalized anxiety disorder) 05/31/2021   Allergic sinusitis 05/31/2021   Chronic idiopathic constipation 05/31/2021   History of CVA (cerebrovascular accident) 05/31/2021   Left shoulder pain 08/13/2001    PCP: Ihor Dow, MD  REFERRING PROVIDER: Johnette Abraham, MD [facility]  REFERRING DIAG: M54.50 (ICD-10-CM) - Acute right-sided low back pain without sciatica  Rationale for Evaluation and Treatment: Rehabilitation  THERAPY DIAG:  Low back pain, unspecified back pain laterality, unspecified chronicity, unspecified whether sciatica present  ONSET DATE: chronic but latest flare mid December.  SUBJECTIVE:                                                                                                                                                                                            SUBJECTIVE STATEMENT:  " I little better I guess"; pain comes and goes; some days are worse than others.  " About 60-70% better"; rates pain 3/10 today  Evaluation:  History of injuring right lower side of the back previously; went back to work after working from home for a year and half.  Works at [employer].  Moving items in the cooler. Next day was hurting in right side of low back and around  into groin.  Saw the MD.  Put on meloxicam and referred for therapy.  Still working although not at [employer].  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  Has a 2 yo son who is autistic and he weighs 50#'s; states MD wanted him to limit what he picked up to 24#.  PERTINENT HISTORY:  Chronic back pain over the years  PAIN:  Are you having pain? Yes: NPRS scale: 4/10 Pain location: right side low back and right hip Pain description: aching, sore Aggravating factors: bending, lifting heavy,  Relieving factors: meloxicam, heat and stretching  PRECAUTIONS: None  WEIGHT BEARING RESTRICTIONS: No  FALLS:  Has patient fallen in last 6 months? No  LIVING ENVIRONMENT: Lives with: lives with their family Lives in: House/apartment Stairs: Yes: Internal: 16 steps; on left going up and External: 3-4 steps; on left going up Has following equipment at home: None  OCCUPATION: works at [employer]: learn how to take care of my back  NEXT MD VISIT: no set follow up  OBJECTIVE:   DIAGNOSTIC FINDINGS:  No x-rays taken  PATIENT SURVEYS:  Modified Oswestry 18/50 36% disability    COGNITION: Overall cognitive status: Within functional limits for tasks assessed     SENSATION: WFL N/T in feet and hands to due Multiple Schlerosis   POSTURE: rounded shoulders and forward head  PALPATION: Tightness  noted thoracic paraspinals > lumbar paraspinals  LUMBAR ROM:   AROM eval 09/26/22  Flexion full full  Extension 75% available* full  Right lateral flexion "Pinch" to knee Full to past knee  Left lateral flexion To about 3" past knee Full to past knee  Right rotation    Left rotation     (Blank rows = not tested)   LOWER EXTREMITY MMT:    MMT Right eval Left eval Right 09/26/22 Left 09/26/22  Hip flexion 4* 5 4+* 5  Hip extension 4-* 4+ 4+ 5  Hip abduction      Hip adduction      Hip internal rotation      Hip external rotation      Knee flexion 4 4+ 5 5  Knee extension 4+ 5 5 5  $ Ankle dorsiflexion 5 5 5 5  $ Ankle plantarflexion      Ankle inversion      Ankle eversion       (Blank rows = not tested)  FUNCTIONAL TESTS:  5 times sit to stand: 19.92 sec  GAIT: Distance walked: 40 Assistive device utilized: None Level of assistance: Modified independence Comments: slight antalgic gait  TODAY'S TREATMENT:                                                                                                                              DATE:  09/26/22 Progress note Modified Oswestry 8/50 16% 5 times sit to stand 15.76 sec MMT's see above  Plank 3 x 20"  Lat pull 5 plates 2 x 10 BTB scapular retraction 2 x 10 BTB shoulder extension 2 x 10       09/17/22 Bridge 3 x 10 SLR with ab brace 3 x 10 Deadbug 2 x 10 (knees bent) Sideplank (from knees) 3 x 45" each  Supine piriformis stretch 3 x 30"   Band Row BTB 2 x 10 Shoulder extension BTB 2 x 10  Shoulder ER BTB 20 x 5"    09/12/22 Goal review/HEP review Supine:  bridge 10X  Trans. Abdominal bracing 10X5" Standing: Scap retraction GTB 10X  Rows GTB 10X  Shoulder extension 10X  Paloff GTB 10X each direction  Corner pec stretch 3X30" Sit to stand no UE assist standard chair 10X   08/28/22 physical therapy evaluation and treatment    PATIENT EDUCATION:  Education details: Patient educated on exam findings,  POC, scope of PT, HEP, and good sitting posture. Person educated: Patient Education method: Explanation, Demonstration, and Handouts Education comprehension: verbalized understanding, returned demonstration, verbal cues required, and tactile cues required  HOME EXERCISE PROGRAM: Access Code: [email or web address] URL: [email or web address]  09/17/22 - Shoulder External Rotation and Scapular Retraction with Resistance  - 2 x daily - 7 x weekly - 3 sets - 10 reps - 5 second hold - Supine Active Straight Leg Raise  - 2 x daily - 7 x weekly - 3 sets - 10 reps - Dead Bug  - 2 x daily - 7 x weekly - 3 sets - 10 reps - Supine Piriformis Stretch with Foot on Ground  - 2 x daily - 7 x weekly - 1 sets - 3 reps  Date: 08/28/2022 Prepared by: AP - Rehab Exercises - Supine Bridge  - 1 x daily - 7 x weekly - 3 sets - 10 reps - Supine Transversus Abdominis Bracing - Hands on Stomach  - 1 x daily - 7 x weekly - 3 sets - 10 reps  Date: 09/12/2022 Prepared by: Roseanne Reno Exercises - Supine Bridge  - 1 x daily - 7 x weekly - 3 sets - 10 reps - Supine Transversus Abdominis Bracing - Hands on Stomach  - 1 x daily - 7 x weekly - 3 sets - 10 reps - 5 seconds hold - Standing Anti-Rotation Press with Anchored Resistance  - 2 x daily - 7 x weekly - 2 sets - 10 reps - Standing Row with Resistance with Anchored Resistance at Chest Height Palms Down  - 2 x daily - 7 x weekly - 2 sets - 10 reps - Standing Bilateral Low Shoulder Row with Anchored Resistance  - 2 x daily - 7 x weekly - 2 sets - 10 reps - Single Arm Shoulder Extension with Anchored Resistance  - 2 x daily - 7 x weekly - 2 sets - 10 reps - Corner Pec Major Stretch  - 3 x daily - 7 x weekly - 2 sets - 3 reps - 30 seconds hold - Doorway Pec Stretch at 90 Degrees Abduction  - 2 x daily - 7 x weekly - 2 sets - 3 reps - 30 seconds hold - Sit to Stand Without Arm Support  - 2 x daily - 7 x weekly - 2 sets - 10 reps  ASSESSMENT:  CLINICAL  IMPRESSION: Progress note today. Good improvement noted with all objective measures noted; continues with some mild strength deficits right hip; mild pain end range with lumbar flexion.   Will continue therapy working on continued deficits with strength and  work to progress patient to an independent program in the gym. Patient will continue to benefit from skilled therapy services to reduce remaining deficits and improve functional ability.    OBJECTIVE IMPAIRMENTS: Abnormal gait, decreased activity tolerance, decreased endurance, decreased mobility, difficulty walking, decreased ROM, decreased strength, decreased safety awareness, hypomobility, increased fascial restrictions, impaired perceived functional ability, increased muscle spasms, impaired flexibility, improper body mechanics, postural dysfunction, and pain.   ACTIVITY LIMITATIONS: carrying, lifting, bending, sitting, standing, squatting, sleeping, stairs, transfers, bed mobility, locomotion level, and caring for others  PARTICIPATION LIMITATIONS: meal prep, cleaning, laundry, shopping, community activity, occupation, and yard work  PERSONAL FACTORS: Past/current experiences are also affecting patient's functional outcome.   REHAB POTENTIAL: Good  CLINICAL DECISION MAKING: Stable/uncomplicated  EVALUATION COMPLEXITY: Low   GOALS: Goals reviewed with patient? No  SHORT TERM GOALS: Target date: 09/11/2022  patient will be independent with initial HEP  Baseline: Goal status: MET  2.   Patient will self report 30% improvement to improve tolerance for functional activity  Baseline:  Goal status: MET  LONG TERM GOALS: Target date: 09/25/2022  Patient will be independent in self management strategies to improve quality of life and functional outcomes.  Baseline:  Goal status: IN PROGRESS  2.    Patient will self report 50% improvement to improve tolerance for functional activity  Baseline:  Goal status: MET  3.   Patient will increase leg MMTs to 5/5 without pain to promote return to ambulation community distances with minimal deviation.  Baseline: see above Goal status: IN PROGRESS  4.  Patient will improve Modified Oswestry score to 13/50 or less to demonstrate improved functional mobiltiy Baseline: 18/50 Goal status: MET   PLAN:  PT FREQUENCY: 2x/week  PT DURATION: 4 weeks  PLANNED INTERVENTIONS: Therapeutic exercises, Therapeutic activity, Neuromuscular re-education, Balance training, Gait training, Patient/Family education, Joint manipulation, Joint mobilization, Stair training, Orthotic/Fit training, DME instructions, Aquatic Therapy, Dry Needling, Electrical stimulation, Spinal manipulation, Spinal mobilization, Cryotherapy, Moist heat, Compression bandaging, scar mobilization, Splintting, Taping, Traction, Ultrasound, Ionotophoresis 51m/ml Dexamethasone, and Manual therapy  PLAN FOR NEXT SESSION: continue to progress core and postural strength while improving overall mobility and function. Continue through 2/26 then likely discharge to gym program; instruct patient on safety with gym type exercise  1:01 PM, 09/26/22 Baudelia Schroepfer Small Kamerin Axford MPT [facility] [place] #[phone]

## 2022-09-27 ENCOUNTER — Encounter (HOSPITAL_COMMUNITY): Payer: Medicaid Other

## 2022-10-01 ENCOUNTER — Ambulatory Visit (HOSPITAL_COMMUNITY): Payer: Medicaid Other

## 2022-10-01 DIAGNOSIS — M545 Low back pain, unspecified: Secondary | ICD-10-CM | POA: Diagnosis not present

## 2022-10-01 NOTE — Therapy (Signed)
OUTPATIENT PHYSICAL THERAPY TREATMENT   Patient Name: Nishal Strenger MRN: [id number] DOB:February 11, 1976, 47 y.o., male Today's Date: 10/01/2022  END OF SESSION:  PT End of Session - 10/01/22 0900     Visit Number 5    Number of Visits 8    Date for PT Re-Evaluation 10/11/22    Authorization Type UHC no auth required over 21, 30 visit limit combined therapies  [place] Medicaid [employer];    Authorization - Visit Number 5    Authorization - Number of Visits 30    Progress Note Due on Visit 8    PT Start Time 0900    PT Stop Time 0940    PT Time Calculation (min) 40 min    Activity Tolerance Patient tolerated treatment well    Behavior During Therapy WFL for tasks assessed/performed             Past Medical History:  Diagnosis Date   Anxiety    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   MS (multiple sclerosis) ([place]) 11/29/2021   Chronic intractable headache 11/29/2021   Tinnitus 08/01/2021   GAD (generalized anxiety disorder) 05/31/2021   Allergic sinusitis 05/31/2021   Chronic idiopathic constipation 05/31/2021   History of CVA (cerebrovascular accident) 05/31/2021   Left shoulder pain 08/13/2001    PCP: Ihor Dow, MD  REFERRING PROVIDER: Johnette Abraham, MD [facility]  REFERRING DIAG: M54.50 (ICD-10-CM) - Acute right-sided low back pain without sciatica  Rationale for Evaluation and Treatment: Rehabilitation  THERAPY DIAG:  Low back pain, unspecified back pain laterality, unspecified chronicity, unspecified whether sciatica present  ONSET DATE: chronic but latest flare mid December.  SUBJECTIVE:                                                                                                                                                                                            SUBJECTIVE STATEMENT: Mild soreness only from last visit; rates pain 3/10 today  Evaluation:  History of injuring right lower side of the back previously; went back to work after working from home for a year and half.  Works at [employer].  Moving items in the cooler. Next day was hurting in right side of low back and around into groin.  Saw the MD.  Put on meloxicam and referred for therapy.  Still  working although not at [employer].  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  Has a 70 yo son who is autistic and he weighs 50#'s; states MD wanted him to limit what he picked up to 24#.  PERTINENT HISTORY:  Chronic back pain over the years  PAIN:  Are you having pain? Yes: NPRS scale: 4/10 Pain location: right side low back and right hip Pain description: aching, sore Aggravating factors: bending, lifting heavy,  Relieving factors: meloxicam, heat and stretching  PRECAUTIONS: None  WEIGHT BEARING RESTRICTIONS: No  FALLS:  Has patient fallen in last 6 months? No  LIVING ENVIRONMENT: Lives with: lives with their family Lives in: House/apartment Stairs: Yes: Internal: 16 steps; on left going up and External: 3-4 steps; on left going up Has following equipment at home: None  OCCUPATION: works at [employer]: learn how to take care of my back  NEXT MD VISIT: no set follow up  OBJECTIVE:   DIAGNOSTIC FINDINGS:  No x-rays taken  PATIENT SURVEYS:  Modified Oswestry 18/50 36% disability    COGNITION: Overall cognitive status: Within functional limits for tasks assessed     SENSATION: WFL N/T in feet and hands to due Multiple Schlerosis   POSTURE: rounded shoulders and forward head  PALPATION: Tightness noted thoracic paraspinals > lumbar paraspinals  LUMBAR ROM:   AROM  eval 09/26/22  Flexion full full  Extension 75% available* full  Right lateral flexion "Pinch" to knee Full to past knee  Left lateral flexion To about 3" past knee Full to past knee  Right rotation    Left rotation     (Blank rows = not tested)   LOWER EXTREMITY MMT:    MMT Right eval Left eval Right 09/26/22 Left 09/26/22  Hip flexion 4* 5 4+* 5  Hip extension 4-* 4+ 4+ 5  Hip abduction      Hip adduction      Hip internal rotation      Hip external rotation      Knee flexion 4 4+ 5 5  Knee extension 4+ 5 5 5  $ Ankle dorsiflexion 5 5 5 5  $ Ankle plantarflexion      Ankle inversion      Ankle eversion       (Blank rows = not tested)  FUNCTIONAL TESTS:  5 times sit to stand: 19.92 sec  GAIT: Distance walked: 40 Assistive device utilized: None Level of assistance: Modified independence Comments: slight antalgic gait  TODAY'S TREATMENT:                                                                                                                              DATE:  10/01/22 Nustep seat 11 level 2 x 5' dynamic warm up  Bodycraft Lat pull 6 plates 2 x 10 BTB scapular retraction 2 x 10 BTB shoulder extension 2 x 10 Bodycraft Leg press 6 plates 2  x 10 Cybex Hamstring curl  45# 2 x 10 Bodycraft chest press 5 plates 2 x 10 Prone: Plank 3 x 20"  Supine: LTR 10" x 5 each Hamstring stretch 5 x 20" Piriformis stretch 5 x 20"  Standing: Pec stretch x 20" each    09/26/22 Progress note Modified Oswestry 8/50 16% 5 times sit to stand 15.76 sec MMT's see above  Plank 3 x 20" Lat pull 5 plates 2 x 10 BTB scapular retraction 2 x 10 BTB shoulder extension 2 x 10       09/17/22 Bridge 3 x 10 SLR with ab brace 3 x 10 Deadbug 2 x 10 (knees bent) Sideplank (from knees) 3 x 45" each  Supine piriformis stretch 3 x 30"   Band Row BTB 2 x 10 Shoulder extension BTB 2 x 10  Shoulder ER BTB 20 x 5"    09/12/22 Goal review/HEP review Supine:  bridge 10X  Trans.  Abdominal bracing 10X5" Standing: Scap retraction GTB 10X  Rows GTB 10X  Shoulder extension 10X  Paloff GTB 10X each direction  Corner pec stretch 3X30" Sit to stand no UE assist standard chair 10X   08/28/22 physical therapy evaluation and treatment    PATIENT EDUCATION:  Education details: Patient educated on exam findings, POC, scope of PT, HEP, and good sitting posture. Person educated: Patient Education method: Explanation, Demonstration, and Handouts Education comprehension: verbalized understanding, returned demonstration, verbal cues required, and tactile cues required  HOME EXERCISE PROGRAM: Access Code: [email or web address] URL: [email or web address]  09/17/22 - Shoulder External Rotation and Scapular Retraction with Resistance  - 2 x daily - 7 x weekly - 3 sets - 10 reps - 5 second hold - Supine Active Straight Leg Raise  - 2 x daily - 7 x weekly - 3 sets - 10 reps - Dead Bug  - 2 x daily - 7 x weekly - 3 sets - 10 reps - Supine Piriformis Stretch with Foot on Ground  - 2 x daily - 7 x weekly - 1 sets - 3 reps  Date: 08/28/2022 Prepared by: AP - Rehab Exercises - Supine Bridge  - 1 x daily - 7 x weekly - 3 sets - 10 reps - Supine Transversus Abdominis Bracing - Hands on Stomach  - 1 x daily - 7 x weekly - 3 sets - 10 reps  Date: 09/12/2022 Prepared by: Roseanne Reno Exercises - Supine Bridge  - 1 x daily - 7 x weekly - 3 sets - 10 reps - Supine Transversus Abdominis Bracing - Hands on Stomach  - 1 x daily - 7 x weekly - 3 sets - 10 reps - 5 seconds hold - Standing Anti-Rotation Press with Anchored Resistance  - 2 x daily - 7 x weekly - 2 sets - 10 reps - Standing Row with Resistance with Anchored Resistance at Chest Height Palms Down  - 2 x daily - 7 x weekly - 2 sets - 10 reps - Standing Bilateral Low Shoulder Row with Anchored Resistance  - 2 x daily - 7 x weekly - 2 sets - 10 reps - Single Arm Shoulder Extension with Anchored Resistance  - 2 x daily - 7 x weekly -  2 sets - 10 reps - Corner Pec Major Stretch  - 3 x daily - 7 x weekly - 2 sets - 3 reps - 30 seconds hold - Doorway Pec Stretch at 90 Degrees Abduction  - 2 x daily - 7 x weekly - 2 sets -  3 reps - 30 seconds hold - Sit to Stand Without Arm Support  - 2 x daily - 7 x weekly - 2 sets - 10 reps  ASSESSMENT:  CLINICAL IMPRESSION: Progressed exercise today with resistance exercise; introduced exercise that can be simulated in gym.  Patient needs frequent cues for pacing of activity, breathing and core awareness with exercise. Ended with stretching.  Patient will continue to benefit from skilled therapy services to reduce remaining deficits and improve functional ability.    OBJECTIVE IMPAIRMENTS: Abnormal gait, decreased activity tolerance, decreased endurance, decreased mobility, difficulty walking, decreased ROM, decreased strength, decreased safety awareness, hypomobility, increased fascial restrictions, impaired perceived functional ability, increased muscle spasms, impaired flexibility, improper body mechanics, postural dysfunction, and pain.   ACTIVITY LIMITATIONS: carrying, lifting, bending, sitting, standing, squatting, sleeping, stairs, transfers, bed mobility, locomotion level, and caring for others  PARTICIPATION LIMITATIONS: meal prep, cleaning, laundry, shopping, community activity, occupation, and yard work  PERSONAL FACTORS: Past/current experiences are also affecting patient's functional outcome.   REHAB POTENTIAL: Good  CLINICAL DECISION MAKING: Stable/uncomplicated  EVALUATION COMPLEXITY: Low   GOALS: Goals reviewed with patient? No  SHORT TERM GOALS: Target date: 09/11/2022  patient will be independent with initial HEP  Baseline: Goal status: MET  2.   Patient will self report 30% improvement to improve tolerance for functional activity  Baseline:  Goal status: MET  LONG TERM GOALS: Target date: 09/25/2022  Patient will be independent in self management  strategies to improve quality of life and functional outcomes.  Baseline:  Goal status: IN PROGRESS  2.    Patient will self report 50% improvement to improve tolerance for functional activity  Baseline:  Goal status: MET  3.  Patient will increase leg MMTs to 5/5 without pain to promote return to ambulation community distances with minimal deviation.  Baseline: see above Goal status: IN PROGRESS  4.  Patient will improve Modified Oswestry score to 13/50 or less to demonstrate improved functional mobiltiy Baseline: 18/50 Goal status: MET   PLAN:  PT FREQUENCY: 2x/week  PT DURATION: 4 weeks  PLANNED INTERVENTIONS: Therapeutic exercises, Therapeutic activity, Neuromuscular re-education, Balance training, Gait training, Patient/Family education, Joint manipulation, Joint mobilization, Stair training, Orthotic/Fit training, DME instructions, Aquatic Therapy, Dry Needling, Electrical stimulation, Spinal manipulation, Spinal mobilization, Cryotherapy, Moist heat, Compression bandaging, scar mobilization, Splintting, Taping, Traction, Ultrasound, Ionotophoresis 73m/ml Dexamethasone, and Manual therapy  PLAN FOR NEXT SESSION: continue to progress core and postural strength while improving overall mobility and function. Continue through 2/29 then likely discharge to gym program; instruct patient on safety with gym type exercise  9:39 AM, 10/01/22 Marvel Mcphillips Small Cuca Benassi MPT [place] [facility]  #[phone]

## 2022-10-03 ENCOUNTER — Encounter (HOSPITAL_COMMUNITY): Payer: Medicaid Other

## 2022-10-03 ENCOUNTER — Ambulatory Visit (HOSPITAL_COMMUNITY): Payer: Medicaid Other

## 2022-10-03 DIAGNOSIS — M545 Low back pain, unspecified: Secondary | ICD-10-CM

## 2022-10-03 NOTE — Therapy (Signed)
OUTPATIENT PHYSICAL THERAPY TREATMENT   Patient Name: Benjamin Waters MRN: [id number] DOB:Jul 09, 1976, 47 y.o., male Today's Date: 10/03/2022  END OF SESSION:  PT End of Session - 10/03/22 1438     Visit Number 6    Number of Visits 8    Date for PT Re-Evaluation 10/11/22    Authorization Type UHC no auth required over 21, 30 visit limit combined therapies  [place] Medicaid [employer];    Authorization - Visit Number 6    Authorization - Number of Visits 30    Progress Note Due on Visit 8    PT Start Time 0235    PT Stop Time 0315    PT Time Calculation (min) 40 min    Activity Tolerance Patient tolerated treatment well    Behavior During Therapy [facility] for tasks assessed/performed             Past Medical History:  Diagnosis Date   Anxiety    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   MS (multiple sclerosis) ([place]) 11/29/2021   Chronic intractable headache 11/29/2021   Tinnitus 08/01/2021   GAD (generalized anxiety disorder) 05/31/2021   Allergic sinusitis 05/31/2021   Chronic idiopathic constipation 05/31/2021   History of CVA (cerebrovascular accident) 05/31/2021   Left shoulder pain 08/13/2001    PCP: Ihor Dow, MD  REFERRING PROVIDER: Johnette Abraham, MD [facility]  REFERRING DIAG: M54.50 (ICD-10-CM) - Acute right-sided low back pain without sciatica  Rationale for Evaluation and Treatment: Rehabilitation  THERAPY DIAG:  Low back pain, unspecified back pain laterality, unspecified chronicity, unspecified whether sciatica present  ONSET DATE: chronic but latest flare mid December.  SUBJECTIVE:                                                                                                                                                                                            SUBJECTIVE STATEMENT: Patient reports soreness; but no increased pain  Evaluation:  History of injuring right lower side of the back previously; went back to work after working from home for a year and half.  Works at [employer].  Moving items in the cooler. Next day was hurting in right side of low back and around into groin.  Saw the MD.  Put on meloxicam and referred for therapy.  Still working although not  at [employer].  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  Has a 34 yo son who is autistic and he weighs 50#'s; states MD wanted him to limit what he picked up to 24#.  PERTINENT HISTORY:  Chronic back pain over the years  PAIN:  Are you having pain? Yes: NPRS scale: 4/10 Pain location: right side low back and right hip Pain description: aching, sore Aggravating factors: bending, lifting heavy,  Relieving factors: meloxicam, heat and stretching  PRECAUTIONS: None  WEIGHT BEARING RESTRICTIONS: No  FALLS:  Has patient fallen in last 6 months? No  LIVING ENVIRONMENT: Lives with: lives with their family Lives in: House/apartment Stairs: Yes: Internal: 16 steps; on left going up and External: 3-4 steps; on left going up Has following equipment at home: None  OCCUPATION: works at [employer]: learn how to take care of my back  NEXT MD VISIT: no set follow up  OBJECTIVE:   DIAGNOSTIC FINDINGS:  No x-rays taken  PATIENT SURVEYS:  Modified Oswestry 18/50 36% disability    COGNITION: Overall cognitive status: Within functional limits for tasks assessed     SENSATION: WFL N/T in feet and hands to due Multiple Schlerosis   POSTURE: rounded shoulders and forward head  PALPATION: Tightness noted thoracic paraspinals > lumbar paraspinals  LUMBAR ROM:   AROM eval  09/26/22  Flexion full full  Extension 75% available* full  Right lateral flexion "Pinch" to knee Full to past knee  Left lateral flexion To about 3" past knee Full to past knee  Right rotation    Left rotation     (Blank rows = not tested)   LOWER EXTREMITY MMT:    MMT Right eval Left eval Right 09/26/22 Left 09/26/22  Hip flexion 4* 5 4+* 5  Hip extension 4-* 4+ 4+ 5  Hip abduction      Hip adduction      Hip internal rotation      Hip external rotation      Knee flexion 4 4+ 5 5  Knee extension 4+ 5 5 5  $ Ankle dorsiflexion 5 5 5 5  $ Ankle plantarflexion      Ankle inversion      Ankle eversion       (Blank rows = not tested)  FUNCTIONAL TESTS:  5 times sit to stand: 19.92 sec  GAIT: Distance walked: 40 Assistive device utilized: None Level of assistance: Modified independence Comments: slight antalgic gait  TODAY'S TREATMENT:                                                                                                                              DATE:  10/03/22 Nustep seat 11 level 2 x 5' dynamic warm up Slant board 5 x 20" Bodycraft leg press 7 plates 2 x 10 Bodycraft lat pull 6 plates 2 x 10 Bodycraft chest press 4 plates 2 x 10 Cybex  Hamstring curl  45# 2 x 10 BTB scapular retraction 2 x 10 BTB shoulder extension 2 x 10 Pec/anterior shoulder stretch 3 x 20"  Supine: LTR x 10 Figure 4 piriformis stretch 3 x 20"      10/01/22 Nustep seat 11 level 2 x 5' dynamic warm up  Bodycraft Lat pull 6 plates 2 x 10 BTB scapular retraction 2 x 10 BTB shoulder extension 2 x 10 Bodycraft Leg press 6 plates 2 x 10 Cybex Hamstring curl  45# 2 x 10 Bodycraft chest press 5 plates 2 x 10 Prone: Plank 3 x 20"  Supine: LTR 10" x 5 each Hamstring stretch 5 x 20" Piriformis stretch 5 x 20"  Standing: Pec stretch x 20" each    09/26/22 Progress note Modified Oswestry 8/50 16% 5 times sit to stand 15.76 sec MMT's see above  Plank 3 x 20" Lat pull 5  plates 2 x 10 BTB scapular retraction 2 x 10 BTB shoulder extension 2 x 10       09/17/22 Bridge 3 x 10 SLR with ab brace 3 x 10 Deadbug 2 x 10 (knees bent) Sideplank (from knees) 3 x 45" each  Supine piriformis stretch 3 x 30"   Band Row BTB 2 x 10 Shoulder extension BTB 2 x 10  Shoulder ER BTB 20 x 5"    09/12/22 Goal review/HEP review Supine:  bridge 10X  Trans. Abdominal bracing 10X5" Standing: Scap retraction GTB 10X  Rows GTB 10X  Shoulder extension 10X  Paloff GTB 10X each direction  Corner pec stretch 3X30" Sit to stand no UE assist standard chair 10X   08/28/22 physical therapy evaluation and treatment    PATIENT EDUCATION:  Education details: Patient educated on exam findings, POC, scope of PT, HEP, and good sitting posture. Person educated: Patient Education method: Explanation, Demonstration, and Handouts Education comprehension: verbalized understanding, returned demonstration, verbal cues required, and tactile cues required  HOME EXERCISE PROGRAM: Access Code: [email or web address] URL: [email or web address]  09/17/22 - Shoulder External Rotation and Scapular Retraction with Resistance  - 2 x daily - 7 x weekly - 3 sets - 10 reps - 5 second hold - Supine Active Straight Leg Raise  - 2 x daily - 7 x weekly - 3 sets - 10 reps - Dead Bug  - 2 x daily - 7 x weekly - 3 sets - 10 reps - Supine Piriformis Stretch with Foot on Ground  - 2 x daily - 7 x weekly - 1 sets - 3 reps  Date: 08/28/2022 Prepared by: AP - Rehab Exercises - Supine Bridge  - 1 x daily - 7 x weekly - 3 sets - 10 reps - Supine Transversus Abdominis Bracing - Hands on Stomach  - 1 x daily - 7 x weekly - 3 sets - 10 reps  Date: 09/12/2022 Prepared by: Roseanne Reno Exercises - Supine Bridge  - 1 x daily - 7 x weekly - 3 sets - 10 reps - Supine Transversus Abdominis Bracing - Hands on Stomach  - 1 x daily - 7 x weekly - 3 sets - 10 reps - 5 seconds hold - Standing Anti-Rotation Press  with Anchored Resistance  - 2 x daily - 7 x weekly - 2 sets - 10 reps - Standing Row with Resistance with Anchored Resistance at Chest Height Palms Down  - 2 x daily - 7 x weekly - 2 sets - 10 reps - Standing Bilateral Low Shoulder Row with Anchored Resistance  -  2 x daily - 7 x weekly - 2 sets - 10 reps - Single Arm Shoulder Extension with Anchored Resistance  - 2 x daily - 7 x weekly - 2 sets - 10 reps - Corner Pec Major Stretch  - 3 x daily - 7 x weekly - 2 sets - 3 reps - 30 seconds hold - Doorway Pec Stretch at 90 Degrees Abduction  - 2 x daily - 7 x weekly - 2 sets - 3 reps - 30 seconds hold - Sit to Stand Without Arm Support  - 2 x daily - 7 x weekly - 2 sets - 10 reps  ASSESSMENT:  CLINICAL IMPRESSION: Increased weight with leg press today without issue.  Overall patient is progressing well with strengthening program; increased intensity of piriformis stretch without issue.  Patient will continue to benefit from skilled therapy services to reduce remaining deficits and improve functional ability.    OBJECTIVE IMPAIRMENTS: Abnormal gait, decreased activity tolerance, decreased endurance, decreased mobility, difficulty walking, decreased ROM, decreased strength, decreased safety awareness, hypomobility, increased fascial restrictions, impaired perceived functional ability, increased muscle spasms, impaired flexibility, improper body mechanics, postural dysfunction, and pain.   ACTIVITY LIMITATIONS: carrying, lifting, bending, sitting, standing, squatting, sleeping, stairs, transfers, bed mobility, locomotion level, and caring for others  PARTICIPATION LIMITATIONS: meal prep, cleaning, laundry, shopping, community activity, occupation, and yard work  PERSONAL FACTORS: Past/current experiences are also affecting patient's functional outcome.   REHAB POTENTIAL: Good  CLINICAL DECISION MAKING: Stable/uncomplicated  EVALUATION COMPLEXITY: Low   GOALS: Goals reviewed with patient?  No  SHORT TERM GOALS: Target date: 09/11/2022  patient will be independent with initial HEP  Baseline: Goal status: MET  2.   Patient will self report 30% improvement to improve tolerance for functional activity  Baseline:  Goal status: MET  LONG TERM GOALS: Target date: 09/25/2022  Patient will be independent in self management strategies to improve quality of life and functional outcomes.  Baseline:  Goal status: IN PROGRESS  2.    Patient will self report 50% improvement to improve tolerance for functional activity  Baseline:  Goal status: MET  3.  Patient will increase leg MMTs to 5/5 without pain to promote return to ambulation community distances with minimal deviation.  Baseline: see above Goal status: IN PROGRESS  4.  Patient will improve Modified Oswestry score to 13/50 or less to demonstrate improved functional mobiltiy Baseline: 18/50 Goal status: MET   PLAN:  PT FREQUENCY: 2x/week  PT DURATION: 4 weeks  PLANNED INTERVENTIONS: Therapeutic exercises, Therapeutic activity, Neuromuscular re-education, Balance training, Gait training, Patient/Family education, Joint manipulation, Joint mobilization, Stair training, Orthotic/Fit training, DME instructions, Aquatic Therapy, Dry Needling, Electrical stimulation, Spinal manipulation, Spinal mobilization, Cryotherapy, Moist heat, Compression bandaging, scar mobilization, Splintting, Taping, Traction, Ultrasound, Ionotophoresis 88m/ml Dexamethasone, and Manual therapy  PLAN FOR NEXT SESSION: continue to progress core and postural strength while improving overall mobility and function. Continue through 2/29 then likely discharge to gym program; instruct patient on safety with gym type exercise  3:12 PM, 10/03/22 Barrie Wale Small Makynleigh Breslin MPT [facility]  #[phone]

## 2022-10-08 ENCOUNTER — Ambulatory Visit (HOSPITAL_COMMUNITY): Payer: Medicaid Other

## 2022-10-08 DIAGNOSIS — M545 Low back pain, unspecified: Secondary | ICD-10-CM | POA: Diagnosis not present

## 2022-10-08 NOTE — Therapy (Signed)
OUTPATIENT PHYSICAL THERAPY TREATMENT/ DISCHARGE PHYSICAL THERAPY DISCHARGE SUMMARY  Visits from Start of Care: 7  Current functional level related to goals / functional outcomes: See below   Remaining deficits: See below   Education / Equipment: See below   Patient agrees to discharge. Patient goals were partially met. Patient is being discharged due to being pleased with the current functional level.    Patient Name: Brayten Langa MRN: [id number] DOB:[birth year], 47 y.o., male Today's Date: 10/08/2022  END OF SESSION:  PT End of Session - 10/08/22 0906     Visit Number 7    Number of Visits 8    Date for PT Re-Evaluation 10/11/22    Authorization Type UHC no auth required over 21, 30 visit limit combined therapies  [place] Medicaid [employer];    Authorization - Visit Number 7    Authorization - Number of Visits 30    Progress Note Due on Visit 8    PT Start Time 0905    PT Stop Time 0945    PT Time Calculation (min) 40 min    Activity Tolerance Patient tolerated treatment well    Behavior During Therapy [facility] for tasks assessed/performed             Past Medical History:  Diagnosis Date   Anxiety    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   MS (multiple sclerosis) ([place]) 11/29/2021   Chronic intractable headache 11/29/2021   Tinnitus 08/01/2021   GAD (generalized anxiety disorder) 05/31/2021   Allergic sinusitis 05/31/2021   Chronic idiopathic constipation 05/31/2021   History of CVA (cerebrovascular accident) 05/31/2021   Left shoulder pain 08/13/2001    PCP: Ihor Dow, MD  REFERRING PROVIDER: Johnette Abraham, MD [facility]  REFERRING DIAG: M54.50 (ICD-10-CM) - Acute right-sided low back pain without sciatica  Rationale for Evaluation and Treatment: Rehabilitation  THERAPY DIAG:  Low back pain, unspecified back pain laterality, unspecified chronicity, unspecified whether sciatica present  ONSET DATE: chronic but latest flare mid December.  SUBJECTIVE:                                                                                                                                                                                           SUBJECTIVE STATEMENT: Patient reports about "80%" better  Evaluation:  History of injuring right lower  side of the back previously; went back to work after working from home for a year and half.  Works at [employer].  Moving items in the cooler. Next day was hurting in right side of low back and around into groin.  Saw the MD.  Put on meloxicam and referred for therapy.  Still working although not at [employer].  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  Has a 82 yo son who is autistic and he weighs 50#'s; states MD wanted him to limit what he picked up to 24#.  PERTINENT HISTORY:  Chronic back pain over the years  PAIN:  Are you having pain? Yes: NPRS scale: 4/10 Pain location: right side low back and right hip Pain description: aching, sore Aggravating factors: bending, lifting heavy,  Relieving factors: meloxicam, heat and stretching  PRECAUTIONS: None  WEIGHT BEARING RESTRICTIONS: No  FALLS:  Has patient fallen in last 6 months? No  LIVING ENVIRONMENT: Lives with: lives with their family Lives in: House/apartment Stairs: Yes: Internal: 16 steps; on left going up and External: 3-4 steps; on left going up Has following equipment at home: None  OCCUPATION: works at [employer]: learn how to take care of my back  NEXT MD VISIT: no set follow up  OBJECTIVE:   DIAGNOSTIC FINDINGS:  No x-rays  taken  PATIENT SURVEYS:  Modified Oswestry 18/50 36% disability    COGNITION: Overall cognitive status: Within functional limits for tasks assessed     SENSATION: WFL N/T in feet and hands to due Multiple Schlerosis   POSTURE: rounded shoulders and forward head  PALPATION: Tightness noted thoracic paraspinals > lumbar paraspinals  LUMBAR ROM:   AROM eval 09/26/22  Flexion full full  Extension 75% available* full  Right lateral flexion "Pinch" to knee Full to past knee  Left lateral flexion To about 3" past knee Full to past knee  Right rotation    Left rotation     (Blank rows = not tested)   LOWER EXTREMITY MMT:    MMT Right eval Left eval Right 09/26/22 Left 09/26/22 Right 10/08/22  Hip flexion 4* 5 4+* 5 4+  Hip extension 4-* 4+ 4+ 5 4+  Hip abduction       Hip adduction       Hip internal rotation       Hip external rotation       Knee flexion 4 4+ '5 5 5  '$ Knee extension 4+ '5 5 5 5  '$ Ankle dorsiflexion '5 5 5 5 5  '$ Ankle plantarflexion       Ankle inversion       Ankle eversion        (Blank rows = not tested)  FUNCTIONAL TESTS:  5 times sit to stand: 19.92 sec  GAIT: Distance walked: 40 Assistive device utilized: None Level of assistance: Modified independence Comments: slight antalgic gait  TODAY'S TREATMENT:  DATE:  10/08/22 Progress note Nustep seat 11 level 3 x 5' dynamic warm up 5 times sit to stand 11.7 sec Modified Oswestry 5/50 10% disability  Bodycraft leg press 7 plates 2 x 10 Bodycraft lat pull 6 plates 2 x 10 Bodycraft chest press 4 plates 2 x 10 Cybex Hamstring curl  45# 2 x 10 Bodycraft chest press 5 plates 2 x 10 Chest/ anterior pec stretch 3 x 20"  Supine: LTR x 10 Piriformis stretch 3 x 20"      10/03/22 Nustep seat 11 level 2 x 5' dynamic warm up Slant board 5 x 20" Bodycraft leg press 7 plates  2 x 10 Bodycraft lat pull 6 plates 2 x 10 Bodycraft chest press 4 plates 2 x 10 Cybex Hamstring curl  45# 2 x 10 BTB scapular retraction 2 x 10 BTB shoulder extension 2 x 10 Pec/anterior shoulder stretch 3 x 20"  Supine: LTR x 10 Figure 4 piriformis stretch 3 x 20"      10/01/22 Nustep seat 11 level 2 x 5' dynamic warm up  Bodycraft Lat pull 6 plates 2 x 10 BTB scapular retraction 2 x 10 BTB shoulder extension 2 x 10 Bodycraft Leg press 6 plates 2 x 10 Cybex Hamstring curl  45# 2 x 10 Bodycraft chest press 5 plates 2 x 10 Prone: Plank 3 x 20"  Supine: LTR 10" x 5 each Hamstring stretch 5 x 20" Piriformis stretch 5 x 20"  Standing: Pec stretch x 20" each    09/26/22 Progress note Modified Oswestry 8/50 16% 5 times sit to stand 15.76 sec MMT's see above  Plank 3 x 20" Lat pull 5 plates 2 x 10 BTB scapular retraction 2 x 10 BTB shoulder extension 2 x 10       09/17/22 Bridge 3 x 10 SLR with ab brace 3 x 10 Deadbug 2 x 10 (knees bent) Sideplank (from knees) 3 x 45" each  Supine piriformis stretch 3 x 30"   Band Row BTB 2 x 10 Shoulder extension BTB 2 x 10  Shoulder ER BTB 20 x 5"    09/12/22 Goal review/HEP review Supine:  bridge 10X  Trans. Abdominal bracing 10X5" Standing: Scap retraction GTB 10X  Rows GTB 10X  Shoulder extension 10X  Paloff GTB 10X each direction  Corner pec stretch 3X30" Sit to stand no UE assist standard chair 10X   08/28/22 physical therapy evaluation and treatment    PATIENT EDUCATION:  Education details: Patient educated on exam findings, POC, scope of PT, HEP, and good sitting posture. Person educated: Patient Education method: Explanation, Demonstration, and Handouts Education comprehension: verbalized understanding, returned demonstration, verbal cues required, and tactile cues required  HOME EXERCISE PROGRAM: Access Code: [email or web address] URL: [email or web address]  09/17/22 - Shoulder External  Rotation and Scapular Retraction with Resistance  - 2 x daily - 7 x weekly - 3 sets - 10 reps - 5 second hold - Supine Active Straight Leg Raise  - 2 x daily - 7 x weekly - 3 sets - 10 reps - Dead Bug  - 2 x daily - 7 x weekly - 3 sets - 10 reps - Supine Piriformis Stretch with Foot on Ground  - 2 x daily - 7 x weekly - 1 sets - 3 reps  Date: 08/28/2022 Prepared by: AP - Rehab Exercises - Supine Bridge  - 1 x daily - 7 x weekly - 3 sets - 10 reps - Supine Transversus Abdominis Bracing - Hands on  Stomach  - 1 x daily - 7 x weekly - 3 sets - 10 reps  Date: 09/12/2022 Prepared by: Roseanne Reno Exercises - Supine Bridge  - 1 x daily - 7 x weekly - 3 sets - 10 reps - Supine Transversus Abdominis Bracing - Hands on Stomach  - 1 x daily - 7 x weekly - 3 sets - 10 reps - 5 seconds hold - Standing Anti-Rotation Press with Anchored Resistance  - 2 x daily - 7 x weekly - 2 sets - 10 reps - Standing Row with Resistance with Anchored Resistance at Chest Height Palms Down  - 2 x daily - 7 x weekly - 2 sets - 10 reps - Standing Bilateral Low Shoulder Row with Anchored Resistance  - 2 x daily - 7 x weekly - 2 sets - 10 reps - Single Arm Shoulder Extension with Anchored Resistance  - 2 x daily - 7 x weekly - 2 sets - 10 reps - Corner Pec Major Stretch  - 3 x daily - 7 x weekly - 2 sets - 3 reps - 30 seconds hold - Doorway Pec Stretch at 90 Degrees Abduction  - 2 x daily - 7 x weekly - 2 sets - 3 reps - 30 seconds hold - Sit to Stand Without Arm Support  - 2 x daily - 7 x weekly - 2 sets - 10 reps  ASSESSMENT:  CLINICAL IMPRESSION: Progress note today.  Patient has met all but 1 set rehab goal and will likely achieve this goal with continued adherence to HEP. Patient plans to join the gym and continued exercising.     OBJECTIVE IMPAIRMENTS: Abnormal gait, decreased activity tolerance, decreased endurance, decreased mobility, difficulty walking, decreased ROM, decreased strength, decreased safety awareness,  hypomobility, increased fascial restrictions, impaired perceived functional ability, increased muscle spasms, impaired flexibility, improper body mechanics, postural dysfunction, and pain.   ACTIVITY LIMITATIONS: carrying, lifting, bending, sitting, standing, squatting, sleeping, stairs, transfers, bed mobility, locomotion level, and caring for others  PARTICIPATION LIMITATIONS: meal prep, cleaning, laundry, shopping, community activity, occupation, and yard work  PERSONAL FACTORS: Past/current experiences are also affecting patient's functional outcome.   REHAB POTENTIAL: Good  CLINICAL DECISION MAKING: Stable/uncomplicated  EVALUATION COMPLEXITY: Low   GOALS: Goals reviewed with patient? No  SHORT TERM GOALS: Target date: 09/11/2022  patient will be independent with initial HEP  Baseline: Goal status: MET  2.   Patient will self report 30% improvement to improve tolerance for functional activity  Baseline:  Goal status: MET  LONG TERM GOALS: Target date: 09/25/2022  Patient will be independent in self management strategies to improve quality of life and functional outcomes.  Baseline:  Goal status: MET  2.    Patient will self report 50% improvement to improve tolerance for functional activity  Baseline:  Goal status: MET  3.  Patient will increase leg MMTs to 5/5 without pain to promote return to ambulation community distances with minimal deviation.  Baseline: see above; right hip flexion and ext 4+/5 Goal status: IN PROGRESS  4.  Patient will improve Modified Oswestry score to 13/50 or less to demonstrate improved functional mobiltiy Baseline: 18/50 Goal status: MET   PLAN:  PT FREQUENCY: 2x/week  PT DURATION: 4 weeks  PLANNED INTERVENTIONS: Therapeutic exercises, Therapeutic activity, Neuromuscular re-education, Balance training, Gait training, Patient/Family education, Joint manipulation, Joint mobilization, Stair training, Orthotic/Fit training, DME  instructions, Aquatic Therapy, Dry Needling, Electrical stimulation, Spinal manipulation, Spinal mobilization, Cryotherapy, Moist heat, Compression bandaging, scar mobilization,  Splintting, Taping, Traction, Ultrasound, Ionotophoresis '4mg'$ /ml Dexamethasone, and Manual therapy  PLAN FOR NEXT SESSION: discharge  9:52 AM, 10/08/22 Blessing Ozga Small Aemilia Dedrick MPT [place] [place] [phone]

## 2022-10-11 ENCOUNTER — Encounter: Payer: Self-pay | Admitting: Radiology

## 2022-11-13 ENCOUNTER — Ambulatory Visit: Payer: Medicaid Other | Admitting: Neurology

## 2022-11-19 ENCOUNTER — Ambulatory Visit: Payer: Medicaid Other | Admitting: Neurology

## 2022-12-14 ENCOUNTER — Ambulatory Visit (INDEPENDENT_AMBULATORY_CARE_PROVIDER_SITE_OTHER): Payer: Medicaid Other | Admitting: Internal Medicine

## 2022-12-14 ENCOUNTER — Encounter: Payer: Self-pay | Admitting: Internal Medicine

## 2022-12-14 VITALS — BP 119/73 | HR 70 | Ht 74.0 in | Wt 196.4 lb

## 2022-12-14 DIAGNOSIS — G35 Multiple sclerosis: Secondary | ICD-10-CM

## 2022-12-14 DIAGNOSIS — F411 Generalized anxiety disorder: Secondary | ICD-10-CM

## 2022-12-14 DIAGNOSIS — H539 Unspecified visual disturbance: Secondary | ICD-10-CM | POA: Diagnosis not present

## 2022-12-14 DIAGNOSIS — L600 Ingrowing nail: Secondary | ICD-10-CM | POA: Diagnosis not present

## 2022-12-14 DIAGNOSIS — J309 Allergic rhinitis, unspecified: Secondary | ICD-10-CM

## 2022-12-14 NOTE — Assessment & Plan Note (Signed)
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

## 2022-12-14 NOTE — Assessment & Plan Note (Signed)
Followed by neurology Not on any treatment currently, needs to get surveillance MRI - as he missed Neurology appt, will try to get MRI of brain as he has visual disturbance currently, although his symptoms are not typical of optic neuritis Has intermittent neurologic symptoms

## 2022-12-14 NOTE — Assessment & Plan Note (Signed)
Takes Zyrtec 10 mg PRN Has Flonase

## 2022-12-14 NOTE — Assessment & Plan Note (Signed)
Referred to podiatry.

## 2022-12-14 NOTE — Patient Instructions (Signed)
Please continue to take medications as prescribed.  Please continue to follow heart healthy diet and perform moderate exercise/walking at least 150 mins/week.  You are being referred to Podiatry.

## 2022-12-14 NOTE — Progress Notes (Signed)
Established Patient Office Visit  Subjective:  Patient ID: Benjamin Waters, male    DOB: 08/20/1975  Age: 47 y.o. MRN: [id number]  CC:  Chief Complaint  Patient presents with   Anxiety    Follow up. Patient states he has an ingrown toenail on the right foot big toe. Patient states he is having trouble with his vision     HPI Benjamin Waters is a 47 y.o. male with past medical history of GAD, MS and allergic sinusitis who presents for f/u of his chronic medical conditions.  He reports bilateral visual disturbance-blurry vision for the last 6 months.  He has difficulty reading and with the near vision.  Denies any double vision.  Of note, he has history of MS and is not on any treatment currently.  Denies any redness of the eye or discharge.  Denies any eye pain.  He has history of panic episodes, but does not recall any triggers.  He denies any dyspnea during the episode, but feels enclosed in a space, and has headache, chest pain, diffuse muscle aches and blurry vision. He also reports clenching of his hands and jerking of UE.  He takes Ativan as needed for panic episodes.   He has been followed by neurologist for intermittent jerking movements, numbness and weakness of the facial area and weakness of RUE and RLE.  He has been diagnosed with MS, but he prefers not to start any treatment for now.  He also reports a ingrown toenail on bilateral great toes.  He has pain and mild swelling on the right great toe.  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 No pertinent surgical history.  Family History  Problem Relation Age of Onset   Lung cancer Father    Hypertension Sister    Cancer Sister    Drug abuse Sister     Social History   Socioeconomic History   Marital status: Media planner    Spouse name: Not on file   Number of children: Not on file   Years of education: Not on file   Highest education  level: Some college, no degree  Occupational History   Not on file  Tobacco Use   Smoking status: Some Days    Types: Cigarettes    Last attempt to quit: 09/09/2015    Years since quitting: 7.2   Smokeless tobacco: Never  Vaping Use   Vaping Use: Never used  Substance and Sexual Activity   Alcohol use: Yes    Alcohol/week: 10.0 standard drinks of alcohol    Types: 10 Cans of beer per week   Drug use: Not Currently    Types: Marijuana   Sexual activity: Yes  Other Topics Concern   Not on file  Social History Narrative   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   Right handed    Social Determinants of Health   Financial Resource Strain: High Risk (12/13/2022)   Overall Financial Resource Strain (CARDIA)    Difficulty of Paying Living Expenses: Hard  Food Insecurity: Food Insecurity Present (12/13/2022)   Hunger Vital Sign    Worried About Running Out of Food in the Last Year: Sometimes true    Ran Out of Food in the Last Year: Never true  Transportation Needs: Unmet Transportation Needs (12/13/2022)   PRAPARE - Administrator, Civil Service (Medical): No    Lack  of Transportation (Non-Medical): Yes  Physical Activity: Insufficiently Active (12/13/2022)   Exercise Vital Sign    Days of Exercise per Week: 3 days    Minutes of Exercise per Session: 30 min  Stress: Stress Concern Present (12/13/2022)   [employer] of Occupational Health - Occupational Stress Questionnaire    Feeling of Stress : Very much  Social Connections: Moderately Isolated (12/13/2022)   Social Connection and Isolation Panel [NHANES]    Frequency of Communication with Friends and Family: Once a week    Frequency of Social Gatherings with Friends and Family: Once a week    Attends Religious Services: 1 to 4 times per year    Active Member of [employer] or Organizations: No    Attends Engineer, structural: Not on file    Marital Status: Living with  partner  Intimate Partner Violence: Not on file    Outpatient Medications Prior to Visit  Medication Sig Dispense Refill   cetirizine (ZYRTEC) 10 MG tablet Take 1 tablet by mouth once daily 30 tablet 11   fluticasone (FLONASE) 50 MCG/ACT nasal spray Use 2 spray(s) in each nostril once daily 16 g 0   LORazepam (ATIVAN) 0.5 MG tablet Take 1 tablet by mouth twice daily as needed for anxiety 30 tablet 1   sodium chloride (OCEAN) 0.65 % nasal spray Place 1 spray into the nose as needed for congestion. 30 mL 12   tiZANidine (ZANAFLEX) 4 MG tablet Take 1 tablet (4 mg total) by mouth every 8 (eight) hours as needed for muscle spasms. 30 tablet 1   No facility-administered medications prior to visit.    No Known Allergies  ROS Review of Systems  Constitutional:  Negative for chills and fever.  HENT:  Positive for congestion, postnasal drip, sinus pressure and sore throat.   Eyes:  Positive for visual disturbance. Negative for pain and discharge.  Respiratory:  Negative for cough and shortness of breath.   Cardiovascular:  Negative for chest pain and palpitations.  Gastrointestinal:  Positive for constipation. Negative for diarrhea, nausea and vomiting.  Endocrine: Negative for polydipsia and polyuria.  Genitourinary:  Negative for dysuria and hematuria.  Musculoskeletal:  Negative for neck pain and neck stiffness.  Skin:  Negative for rash.  Allergic/Immunologic: Positive for environmental allergies.  Neurological:  Positive for dizziness, weakness and headaches. Negative for numbness.  Psychiatric/Behavioral:  Negative for agitation and behavioral problems. The patient is nervous/anxious.       Objective:    Physical Exam Vitals reviewed.  Constitutional:      General: He is not in acute distress.    Appearance: He is not diaphoretic.  HENT:     Head: Normocephalic and atraumatic.     Nose: Congestion present.     Right Sinus: No frontal sinus tenderness.     Left Sinus: No  frontal sinus tenderness.     Mouth/Throat:     Mouth: Mucous membranes are moist.  Eyes:     General: No scleral icterus.    Extraocular Movements: Extraocular movements intact.  Cardiovascular:     Rate and Rhythm: Normal rate and regular rhythm.     Heart sounds: Normal heart sounds. No murmur heard. Pulmonary:     Breath sounds: Normal breath sounds. No wheezing or rales.  Musculoskeletal:     Cervical back: Neck supple. No tenderness.     Right lower leg: No edema.     Left lower leg: No edema.  Feet:     Right  foot:     Toenail Condition: Right toenails are ingrown.     Left foot:     Toenail Condition: Left toenails are ingrown.  Skin:    General: Skin is warm.     Findings: No rash.  Neurological:     General: No focal deficit present.     Mental Status: He is alert and oriented to person, place, and time.     Cranial Nerves: No cranial nerve deficit.     Sensory: No sensory deficit.     Motor: No weakness.  Psychiatric:        Mood and Affect: Mood normal.        Behavior: Behavior normal.     BP 119/73 (BP Location: Left Arm, Patient Position: Sitting, Cuff Size: Normal)   Pulse 70   Ht 6\' 2"  (1.88 m)   Wt 196 lb 6.4 oz (89.1 kg)   SpO2 97%   BMI 25.22 kg/m  Wt Readings from Last 3 Encounters:  12/14/22 196 lb 6.4 oz (89.1 kg)  08/03/22 200 lb (90.7 kg)  06/08/22 200 lb 6.4 oz (90.9 kg)    Lab Results  Component Value Date   TSH 2.410 06/08/2022   Lab Results  Component Value Date   WBC 7.8 06/08/2022   HGB 15.0 06/08/2022   HCT 43.7 06/08/2022   MCV 91 06/08/2022   PLT 220 06/08/2022   Lab Results  Component Value Date   NA 140 06/08/2022   K 4.7 06/08/2022   CO2 23 06/08/2022   GLUCOSE 104 (H) 06/08/2022   BUN 13 06/08/2022   CREATININE 1.02 06/08/2022   BILITOT 0.3 06/08/2022   ALKPHOS 80 06/08/2022   AST 17 06/08/2022   ALT 18 06/08/2022   PROT 6.7 06/08/2022   ALBUMIN 4.3 06/08/2022   CALCIUM 9.3 06/08/2022   EGFR 93 06/08/2022    Lab Results  Component Value Date   CHOL 132 06/08/2022   Lab Results  Component Value Date   HDL 27 (L) 06/08/2022   Lab Results  Component Value Date   LDLCALC 76 06/08/2022   Lab Results  Component Value Date   TRIG 168 (H) 06/08/2022   Lab Results  Component Value Date   CHOLHDL 4.9 06/08/2022   Lab Results  Component Value Date   HGBA1C 5.3 06/08/2022      Assessment & Plan:   Problem List Items Addressed This Visit       Respiratory   Allergic sinusitis    Takes Zyrtec 10 mg PRN Has Flonase        Nervous and Auditory   MS (multiple sclerosis) (HCC)    Followed by neurology Not on any treatment currently, needs to get surveillance MRI - as he missed Neurology appt, will try to get MRI of brain as he has visual disturbance currently, although his symptoms are not typical of optic neuritis Has intermittent neurologic symptoms      Relevant Orders   MR Brain W Wo Contrast   Ambulatory referral to Ophthalmology     Musculoskeletal and Integument   Ingrown toenail of both feet    Referred to podiatry      Relevant Orders   Ambulatory referral to Podiatry     Other   GAD (generalized anxiety disorder) - Primary    Likely panic episodes - Ativan PRN Overall well-controlled Did not tolerate SSRI in the past Uses marijuana, strongly advised to avoid marijuana use as he is taking BZD  Visual disturbances    Considering his chronic symptoms with near vision difficulty, 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   Follow-up: No follow-ups on file.    Anabel Halon, MD

## 2022-12-14 NOTE — Assessment & Plan Note (Addendum)
Likely panic episodes - Ativan PRN Overall well-controlled Did not tolerate SSRI in the past Uses marijuana, strongly advised to avoid marijuana use as he is taking BZD

## 2022-12-24 ENCOUNTER — Ambulatory Visit: Payer: Medicaid Other | Admitting: Podiatry

## 2022-12-24 DIAGNOSIS — L600 Ingrowing nail: Secondary | ICD-10-CM | POA: Diagnosis not present

## 2022-12-24 NOTE — Progress Notes (Signed)
       Chief Complaint  Patient presents with   Ingrown Toenail    BIL great toe ingrowns. Concerns about great toenails splitting.     HPI: 47 y.o. male presenting today with concern of painful ingrown toenails on the bilateral great toes.  He notes that the right great toe lateral nail border is the worst.  He is requesting that the only be trimmed back today and does not want a PNA procedure.  He noted he may opt to have this performed in the future.  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  No Known Allergies     Physical Exam:  General: The patient is alert and oriented x3 in no acute distress.  Dermatology: Skin is warm, dry and supple bilateral lower extremities. Interspaces are clear of maceration and debris.  There are incurvated nail corners distally along the bilateral hallux nail borders.  No active drainage or paronychia seen.  There is pain on palpation to the distal lateral edge of the hallux nails.  Vascular: Palpable pedal pulses bilaterally. Capillary refill within normal limits.  No appreciable edema.  No erythema or calor.  Neurological: Light touch sensation grossly intact bilateral feet.   Musculoskeletal Exam: No pedal deformities noted   Assessment/Plan of Care: 1. Ingrown toenail     Discussed clinical findings with patient today. Time was spent discussing the procedure of a PNA to the affected toenail borders.  He had not been aware of this procedure previously and was not aware that he could have something like this done to prevent ingrown nails.  Since he still was not ready to proceed with a PNA the distal lateral nail borders were cut back with sterile nail nippers until he was comfortable with pressure on the distal lateral nail borders.  Triple antibiotic ointment was applied to the right hallux lateral nail border where he was having the most discomfort.  He  was advised to apply a small amount every night to the area.  Follow-up as needed   Clerance Lav, DPM, FACFAS [facility]     [address] [place], [place] [address]                Office [phone]  Fax [phone]

## 2023-01-09 ENCOUNTER — Ambulatory Visit (HOSPITAL_COMMUNITY): Payer: Medicaid Other | Attending: Internal Medicine

## 2023-01-16 ENCOUNTER — Other Ambulatory Visit: Payer: Self-pay | Admitting: Internal Medicine

## 2023-01-16 DIAGNOSIS — F411 Generalized anxiety disorder: Secondary | ICD-10-CM

## 2023-02-19 ENCOUNTER — Encounter: Payer: Self-pay | Admitting: Internal Medicine

## 2023-04-19 ENCOUNTER — Telehealth: Payer: Self-pay | Admitting: Internal Medicine

## 2023-04-19 ENCOUNTER — Encounter: Payer: Self-pay | Admitting: Internal Medicine

## 2023-04-19 ENCOUNTER — Ambulatory Visit (INDEPENDENT_AMBULATORY_CARE_PROVIDER_SITE_OTHER): Payer: Medicaid Other | Admitting: Internal Medicine

## 2023-04-19 VITALS — BP 132/76 | HR 66 | Ht 74.0 in | Wt 195.6 lb

## 2023-04-19 DIAGNOSIS — G35 Multiple sclerosis: Secondary | ICD-10-CM | POA: Diagnosis not present

## 2023-04-19 DIAGNOSIS — F411 Generalized anxiety disorder: Secondary | ICD-10-CM | POA: Diagnosis not present

## 2023-04-19 DIAGNOSIS — J309 Allergic rhinitis, unspecified: Secondary | ICD-10-CM

## 2023-04-19 DIAGNOSIS — J3489 Other specified disorders of nose and nasal sinuses: Secondary | ICD-10-CM | POA: Diagnosis not present

## 2023-04-19 DIAGNOSIS — H539 Unspecified visual disturbance: Secondary | ICD-10-CM

## 2023-04-19 MED ORDER — FLUTICASONE PROPIONATE 50 MCG/ACT NA SUSP: 2.0000 | Freq: Every day | NASAL | 5 refills | Status: AC

## 2023-04-19 MED ORDER — MUPIROCIN 2 % EX OINT: 1.0000 | TOPICAL_OINTMENT | Freq: Two times a day (BID) | CUTANEOUS | 0 refills | Status: AC

## 2023-04-19 NOTE — Patient Instructions (Signed)
Please apply Mupirocin ointment to nasal sore as discussed.  Continue using Flonase for allergies.

## 2023-04-19 NOTE — Assessment & Plan Note (Signed)
Followed by neurology Not on any treatment currently, needs to get surveillance MRI - he has Neurology appt, had ordered MRI of brain as he has visual disturbance, but it has not been done yet - although his symptoms are not typical of optic neuritis Has intermittent neurologic symptoms

## 2023-04-19 NOTE — Assessment & Plan Note (Signed)
Takes Zyrtec 10 mg PRN Has Flonase

## 2023-04-19 NOTE — Progress Notes (Signed)
Established Patient Office Visit  Subjective:  Patient ID: Benjamin Waters, male    DOB: Oct 14, 1975  Age: 48 y.o. MRN: [id number]  CC:  Chief Complaint  Patient presents with   Anxiety    Follow up    arm sores    Patient is having recurrent sores that come up on his arm    HPI Benjamin Waters is a 47 y.o. male with past medical history of GAD, MS and allergic sinusitis who presents for f/u of his chronic medical conditions.  He reports bilateral visual disturbance-blurry vision for the last 6 months.  He has difficulty reading and with the near vision.  Denies any double vision.  Of note, he has history of MS and is not on any treatment currently.  Denies any redness of the eye or discharge.  Denies any eye pain.  He has history of panic episodes, but does not recall any triggers.  He denies any dyspnea during the episode, but feels enclosed in a space, and has headache, chest pain, diffuse muscle aches and blurry vision. He also reports clenching of his hands and jerking of UE.  He takes Ativan as needed for panic episodes.   He has been followed by neurologist for intermittent jerking movements, numbness and weakness of the facial area and weakness of RUE and RLE.  He has been diagnosed with MS, but he prefers not to start any treatment for now.  He reports recurrent nasal sores.  He has stopped trimming nasal hair with scissor now.  He has been using Flonase for allergic sinusitis.  He has noticed redness inside the nostrils and intermittent mild oozing of blood.  Denies any fever or chills currently.  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 No pertinent surgical history.  Family History  Problem Relation Age of Onset   Lung cancer Father    Hypertension Sister    Cancer Sister    Drug abuse Sister     Social History   Socioeconomic History   Marital status: Media planner    Spouse name: Not  on file   Number of children: Not on file   Years of education: Not on file   Highest education level: Some college, no degree  Occupational History   Not on file  Tobacco Use   Smoking status: Some Days    Current packs/day: 0.00    Types: Cigarettes    Last attempt to quit: 09/09/2015    Years since quitting: 7.6   Smokeless tobacco: Never  Vaping Use   Vaping status: Never Used  Substance and Sexual Activity   Alcohol use: Yes    Alcohol/week: 10.0 standard drinks of alcohol    Types: 10 Cans of beer per week   Drug use: Not Currently    Types: Marijuana   Sexual activity: Yes  Other Topics Concern   Not on file  Social History Narrative   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   Right handed    Social Determinants of Health   Financial Resource Strain: High Risk (12/13/2022)   Overall Financial Resource Strain (CARDIA)    Difficulty of Paying Living Expenses: Hard  Food Insecurity: Food Insecurity Present (12/13/2022)   Hunger Vital Sign    Worried About Running Out of Food in the Last Year: Sometimes true    Ran Out of Food in the Last Year: Never  true  Transportation Needs: Unmet Transportation Needs (12/13/2022)   PRAPARE - Transportation    Lack of Transportation (Medical): No    Lack of Transportation (Non-Medical): Yes  Physical Activity: Insufficiently Active (12/13/2022)   Exercise Vital Sign    Days of Exercise per Week: 3 days    Minutes of Exercise per Session: 30 min  Stress: Stress Concern Present (12/13/2022)   [employer] of Occupational Health - Occupational Stress Questionnaire    Feeling of Stress : Very much  Social Connections: Moderately Isolated (12/13/2022)   Social Connection and Isolation Panel [NHANES]    Frequency of Communication with Friends and Family: Once a week    Frequency of Social Gatherings with Friends and Family: Once a week    Attends Religious Services: 1 to 4 times per year     Active Member of [employer] or Organizations: No    Attends Engineer, structural: Not on file    Marital Status: Living with partner  Intimate Partner Violence: Not on file    Outpatient Medications Prior to Visit  Medication Sig Dispense Refill   cetirizine (ZYRTEC) 10 MG tablet Take 1 tablet by mouth once daily 30 tablet 11   LORazepam (ATIVAN) 0.5 MG tablet Take 1 tablet by mouth twice daily as needed for anxiety 30 tablet 1   sodium chloride (OCEAN) 0.65 % nasal spray Place 1 spray into the nose as needed for congestion. 30 mL 12   tiZANidine (ZANAFLEX) 4 MG tablet Take 1 tablet (4 mg total) by mouth every 8 (eight) hours as needed for muscle spasms. 30 tablet 1   fluticasone (FLONASE) 50 MCG/ACT nasal spray Use 2 spray(s) in each nostril once daily 16 g 0   No facility-administered medications prior to visit.    No Known Allergies  ROS Review of Systems  Constitutional:  Negative for chills and fever.  HENT:  Positive for congestion, postnasal drip, sinus pressure and sore throat.   Eyes:  Positive for visual disturbance. Negative for pain and discharge.  Respiratory:  Negative for cough and shortness of breath.   Cardiovascular:  Negative for chest pain and palpitations.  Gastrointestinal:  Positive for constipation. Negative for diarrhea, nausea and vomiting.  Endocrine: Negative for polydipsia and polyuria.  Genitourinary:  Negative for dysuria and hematuria.  Musculoskeletal:  Negative for neck pain and neck stiffness.  Skin:  Negative for rash.  Allergic/Immunologic: Positive for environmental allergies.  Neurological:  Positive for weakness and headaches. Negative for numbness.  Psychiatric/Behavioral:  Negative for agitation and behavioral problems. The patient is nervous/anxious.       Objective:    Physical Exam Vitals reviewed.  Constitutional:      General: He is not in acute distress.    Appearance: He is not diaphoretic.  HENT:     Head:  Normocephalic and atraumatic.     Nose: Mucosal edema and congestion present.     Right Nostril: No epistaxis.     Left Nostril: No epistaxis.     Right Sinus: No frontal sinus tenderness.     Left Sinus: No frontal sinus tenderness.     Mouth/Throat:     Mouth: Mucous membranes are moist.  Eyes:     General: No scleral icterus.    Extraocular Movements: Extraocular movements intact.  Cardiovascular:     Rate and Rhythm: Normal rate and regular rhythm.     Heart sounds: Normal heart sounds. No murmur heard. Pulmonary:     Breath sounds: Normal  breath sounds. No wheezing or rales.  Musculoskeletal:     Cervical back: Neck supple. No tenderness.     Right lower leg: No edema.     Left lower leg: No edema.  Feet:     Right foot:     Toenail Condition: Right toenails are ingrown.     Left foot:     Toenail Condition: Left toenails are ingrown.  Skin:    General: Skin is warm.     Findings: No rash.  Neurological:     General: No focal deficit present.     Mental Status: He is alert and oriented to person, place, and time.     Sensory: No sensory deficit.     Motor: No weakness.  Psychiatric:        Mood and Affect: Mood normal.        Behavior: Behavior normal.     BP 132/76 (BP Location: Right Arm, Patient Position: Sitting, Cuff Size: Normal)   Pulse 66   Ht 6\' 2"  (1.88 m)   Wt 195 lb 9.6 oz (88.7 kg)   SpO2 96%   BMI 25.11 kg/m  Wt Readings from Last 3 Encounters:  04/19/23 195 lb 9.6 oz (88.7 kg)  12/14/22 196 lb 6.4 oz (89.1 kg)  08/03/22 200 lb (90.7 kg)    Lab Results  Component Value Date   TSH 2.410 06/08/2022   Lab Results  Component Value Date   WBC 7.8 06/08/2022   HGB 15.0 06/08/2022   HCT 43.7 06/08/2022   MCV 91 06/08/2022   PLT 220 06/08/2022   Lab Results  Component Value Date   NA 140 06/08/2022   K 4.7 06/08/2022   CO2 23 06/08/2022   GLUCOSE 104 (H) 06/08/2022   BUN 13 06/08/2022   CREATININE 1.02 06/08/2022   BILITOT 0.3  06/08/2022   ALKPHOS 80 06/08/2022   AST 17 06/08/2022   ALT 18 06/08/2022   PROT 6.7 06/08/2022   ALBUMIN 4.3 06/08/2022   CALCIUM 9.3 06/08/2022   EGFR 93 06/08/2022   Lab Results  Component Value Date   CHOL 132 06/08/2022   Lab Results  Component Value Date   HDL 27 (L) 06/08/2022   Lab Results  Component Value Date   LDLCALC 76 06/08/2022   Lab Results  Component Value Date   TRIG 168 (H) 06/08/2022   Lab Results  Component Value Date   CHOLHDL 4.9 06/08/2022   Lab Results  Component Value Date   HGBA1C 5.3 06/08/2022      Assessment & Plan:   Problem List Items Addressed This Visit       Respiratory   Allergic sinusitis    Takes Zyrtec 10 mg PRN Has Flonase      Relevant Medications   fluticasone (FLONASE) 50 MCG/ACT nasal spray     Nervous and Auditory   MS (multiple sclerosis) (HCC) - Primary    Followed by neurology Not on any treatment currently, needs to get surveillance MRI - he has Neurology appt, had ordered MRI of brain as he has visual disturbance, but it has not been done yet - although his symptoms are not typical of optic neuritis Has intermittent neurologic symptoms      Relevant Orders   Ambulatory referral to Ophthalmology     Other   GAD (generalized anxiety disorder)    Likely panic episodes - Ativan PRN Overall well-controlled Did not tolerate SSRI in the past Uses marijuana, strongly advised to avoid  marijuana use as he is taking BZD      Visual disturbances    Considering his chronic symptoms with near vision difficulty, 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 But since he has history of MS, will try to get MRI of brain as he has visual disturbance currently, although his symptoms are not typical of optic neuritis Referred to ophthalmology      Relevant Orders   Ambulatory referral to Ophthalmology   Nasal sore    Recurrent nasal sore likely due to nasal vestibulitis from trimming of nasal hair Mupirocin ointment to prevent bacterial  infection Continue Flonase for nasal congestion      Relevant Medications   mupirocin ointment (BACTROBAN) 2 %     Meds ordered this encounter  Medications   mupirocin ointment (BACTROBAN) 2 %    Sig: Apply 1 Application topically 2 (two) times daily.    Dispense:  22 g    Refill:  0   fluticasone (FLONASE) 50 MCG/ACT nasal spray    Sig: Place 2 sprays into both nostrils daily.    Dispense:  16 g    Refill:  5    Follow-up: Return in about 4 months (around 08/19/2023) for Annual physical.    Anabel Halon, MD

## 2023-04-19 NOTE — Telephone Encounter (Signed)
[facility] called in on patient behalf in regard to recent referral placed   [facility] doers not see patients for routine eye exams and does not see patient with MS   Needs referral to neuro ophthalmologist

## 2023-04-19 NOTE — Assessment & Plan Note (Signed)
Recurrent nasal sore likely due to nasal vestibulitis from trimming of nasal hair Mupirocin ointment to prevent bacterial infection Continue Flonase for nasal congestion

## 2023-04-19 NOTE — Assessment & Plan Note (Signed)
Likely panic episodes - Ativan PRN Overall well-controlled Did not tolerate SSRI in the past Uses marijuana, strongly advised to avoid marijuana use as he is taking BZD

## 2023-04-19 NOTE — Assessment & Plan Note (Signed)
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

## 2023-04-25 ENCOUNTER — Ambulatory Visit (HOSPITAL_COMMUNITY)
Admission: RE | Admit: 2023-04-25 | Discharge: 2023-04-25 | Disposition: A | Discharge status: Home / Self Care | Payer: Medicaid Other | Source: Ambulatory Visit | Attending: Internal Medicine | Admitting: Internal Medicine

## 2023-04-25 DIAGNOSIS — G35 Multiple sclerosis: Secondary | ICD-10-CM | POA: Diagnosis present

## 2023-04-25 MED ORDER — GADOBUTROL 1 MMOL/ML IV SOLN
9.0000 mL | Freq: Once | INTRAVENOUS | Status: AC | PRN
  Administered 2023-04-25: 9 mL via INTRAVENOUS

## 2023-05-20 ENCOUNTER — Other Ambulatory Visit: Payer: Self-pay | Admitting: Internal Medicine

## 2023-05-20 DIAGNOSIS — F411 Generalized anxiety disorder: Secondary | ICD-10-CM

## 2023-05-31 ENCOUNTER — Ambulatory Visit: Payer: Medicaid Other | Admitting: Neurology

## 2023-05-31 ENCOUNTER — Other Ambulatory Visit (INDEPENDENT_AMBULATORY_CARE_PROVIDER_SITE_OTHER): Payer: Medicaid Other

## 2023-05-31 ENCOUNTER — Encounter: Payer: Self-pay | Admitting: Neurology

## 2023-05-31 VITALS — BP 129/86 | HR 96 | Ht 74.0 in | Wt 193.4 lb

## 2023-05-31 DIAGNOSIS — R5383 Other fatigue: Secondary | ICD-10-CM | POA: Diagnosis not present

## 2023-05-31 DIAGNOSIS — R251 Tremor, unspecified: Secondary | ICD-10-CM

## 2023-05-31 DIAGNOSIS — G35 Multiple sclerosis: Secondary | ICD-10-CM

## 2023-05-31 DIAGNOSIS — H538 Other visual disturbances: Secondary | ICD-10-CM

## 2023-05-31 LAB — CBC
HCT: 44.2 % (ref 39.0–52.0)
Hemoglobin: 15.2 g/dL (ref 13.0–17.0)
MCHC: 34.4 g/dL (ref 30.0–36.0)
MCV: 93.6 fL (ref 78.0–100.0)
Platelets: 218 10*3/uL (ref 150.0–400.0)
RBC: 4.72 Mil/uL (ref 4.22–5.81)
RDW: 12.8 % (ref 11.5–15.5)
WBC: 8.2 10*3/uL (ref 4.0–10.5)

## 2023-05-31 LAB — COMPREHENSIVE METABOLIC PANEL
ALT: 19 U/L (ref 0–53)
AST: 16 U/L (ref 0–37)
Albumin: 4.4 g/dL (ref 3.5–5.2)
Alkaline Phosphatase: 71 U/L (ref 39–117)
BUN: 13 mg/dL (ref 6–23)
CO2: 33 meq/L — ABNORMAL HIGH (ref 19–32)
Calcium: 9.7 mg/dL (ref 8.4–10.5)
Chloride: 102 meq/L (ref 96–112)
Creatinine, Ser: 1.16 mg/dL (ref 0.40–1.50)
GFR: 75.84 mL/min (ref >60.00)
Glucose, Bld: 64 mg/dL — ABNORMAL LOW (ref 70–99)
Potassium: 4.7 meq/L (ref 3.5–5.1)
Sodium: 142 meq/L (ref 135–145)
Total Bilirubin: 0.5 mg/dL (ref 0.2–1.2)
Total Protein: 7 g/dL (ref 6.0–8.3)

## 2023-05-31 LAB — VITAMIN D 25 HYDROXY (VIT D DEFICIENCY, FRACTURES): VITD: 25.64 ng/mL — ABNORMAL LOW (ref 30.00–100.00)

## 2023-05-31 LAB — VITAMIN B12: Vitamin B-12: 270 pg/mL (ref 211–911)

## 2023-05-31 NOTE — Patient Instructions (Signed)
Good to see you.  Have bloodwork done for CBC, CMP, vitamin B12, vitamin D  2. Referral will be sent for eye exam  3. We will plan for repeat MRI brain with and without contrast for 04/2024  4. Follow-up in 1 year, call for any changes

## 2023-05-31 NOTE — Progress Notes (Signed)
NEUROLOGY FOLLOW UP OFFICE NOTE  Benjamin Waters [id number] [birth year]  HISTORY OF PRESENT ILLNESS: I had the pleasure of seeing Benjamin Waters in follow-up in the neurology clinic on 05/31/2023.  The patient was last seen a year ago. He was initially seen for episodes of upper body stiffening with low amplitude shaking. EEG was normal in 06/2020. As part of his workup, he had a brain MRI which showed changes concerning for demyelinating disease. Repeat brain MRI with and without contrast done 10/10/2021 did not show any enhancing lesions seen, no changes in the white matter changes seen bilaterally,in the juxtacortical regions, and largest in the left periventricular region. There were no spinal cord lesions on cervical/thoracic MRI. LP showed  >5 well defined gamma restriction bands in CSF with additional identical gamma restriction bands in the CSF and serum, indicative of intra-cerebral as well as systemic synthesis of gammaglobulins. CNS IgG synthesis elevated 18.3, IgG index elevated 1.94.   Since his last visit, he had a repeat brain MRI with and without contrast 04/2023, no acute changes, unchanged white matter disease with dominant 2 cm lesion in the left frontal periventricular white matter. No enhancing lesions seen. He reported vision changes on PCP appointment earlier that month. He mostly notices a significant change in near vision with blurred vision. No eye pain, loss of vision or loss of color vision. In low light settings, he sees weird artifact in the periphery. The headaches have been pretty good, he gets them occasionally with good response to over the counter medication. He gets dizzy sometimes when standing or with positional changes, no falls. He still has tingling in tips of fingers and toes, no focal symptoms. Balance is not as good. He still has the shaking episodes, they are not as frequent, he has them every 1-2 months. He has noticed the tends to have one after he has not taken  Ativan in a while. Sometimes he takes it a couple of times a day, other times he does not need it for a week. He feels fatigued despite getting good sleep. He gets 6-7 hours of sleep. He has a history of B12 deficiency and received B12 injections, last dose was almost a year ago.    History On Initial Assessment 06/05/2021: This is a pleasant 47 year old ambidextrous right hand dominant man with a history of GAD/panic disorder, presenting for evaluation of seizure-like activity. He started having intermittent episodes around 5 years ago, that became progressively worse that they were occurring daily close to 2 years ago. He can sometimes tell when they would start, he would feel weird with a physical sensation that something is wrong with him. Sometimes there is a pressure on the left side of his chest and he feels his heart beating fast or irregularly. He has a wrist BP cuff at home and it would show an irregular rhythm. His upper body would then stiffen/contract with low amplitude shaking. His fists may clench. He has to consciously focus on his breathing and relax his body, which may or may not help. There would be no loss of consciousness but a few times he felt like he would pass out when he had gotten lightheaded. There was one time a few days ago where he had a weird taste in his mouth like metal and he noticed he was feeling nervous/anxious with sweat on his face. He attributed to the taste to shaving cream in his mouth. He was prescribed Ativan close to 2 years ago which  has helped a lot. Episodes are not occurring daily, he has around 1-2 a month. No nocturnal episodes. He is able to catch it now and take the Ativan, if he does not take it soon enough, he takes an additional dose. In the past 3 weeks, he has taken 10-14 tablets. He notices random muscle twitches on his chest, thumb, or different parts of his body. He injured his left shoulder which also causes some anxiety. He lives with his partner, 57  year old son, and 11yo nephew, and has not been told of any staring/unresponsive episodes. He denies any loss of time. He denies any significant headaches, dizziness, diplopia, dysarthria/dysphagia, bladder dysfunction. He has neck/back pain and constipation.   He reports an incident in 2008 when he woke up with the right side of his mouth paralyzed. He went to Urgent Care where "they thought it was Bell's palsy" and prescribed Prednisone. Over the next 1-2 days, he had progressive weakness on the right arm then right leg where he needed to pull his right leg up or use his left arm to lift the right. He could move his right hand but there was no feeling in it, he could not write with it. It took 3 months to improve, and was normal in 6 months. He still notices a little droop on the right side of his mouth when smiling and coordination on the right hand is not as good, but feels 98% better. He has occasional numbness and tingling in his fingers and toes. No falls. Sleep is good. Mood "depends on the day," he reports being very stressed. He works in Airline pilot. His cousin had a brain tumor and seizure. Otherwise he had a normal birth and early development.  There is no history of febrile convulsions, CNS infections such as meningitis/encephalitis, significant traumatic brain injuries, neurosurgical procedures.   Diagnostic Data: MRI brain without contrast done 06/2021 did not show any acute changes. There were multiple white matter changes seen in the periventricular, deep, and juxtacortical regions with a dominant 1.9cm lesion involving the periventricular white matter at the posterior left frontal region. Few of the foci radiate from the lateral ventricles in a perpendicular fashion, with few corresponding T1 black holes.   Cervical/thoracic MRI done 09/2021: No spinal lesions seen.   Lumbar puncture 07/2021: CSF showed a  WBC of 7 with 93 lymphs, normal protein 32 and glucose 57. Culture was negative, no  malignant cells, ACE 3. 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 CNS IgG synthesis elevated 18.3, IgG index elevated 1.94.   Normal wake and sleep EEG in 06/2021.   Bloodwork done was negative for autoimmune workup, his B12 level was 176, he has started B12 injections.  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 16 g 5   LORazepam (ATIVAN) 0.5 MG tablet Take 1 tablet by mouth twice daily as needed for anxiety 30 tablet 1   mupirocin ointment (BACTROBAN) 2 % Apply 1 Application topically 2 (two) times daily. 22 g 0  sodium chloride (OCEAN) 0.65 % nasal spray Place 1 spray into the nose as needed for congestion. 30 mL 12   tiZANidine (ZANAFLEX) 4 MG tablet Take 1 tablet (4 mg total) by mouth every 8 (eight) hours as needed for muscle spasms. 30 tablet 1   No current facility-administered medications on file prior to visit.    ALLERGIES: No Known Allergies  FAMILY HISTORY: Family History  Problem Relation Age of Onset   Lung cancer Father    Hypertension Sister    Cancer Sister    Drug abuse Sister     SOCIAL HISTORY: Social History   Socioeconomic History   Marital status: Media planner    Spouse name: Not on file   Number of children: Not on file   Years of education: Not on file   Highest education level: Some college, no degree  Occupational History   Not on file  Tobacco Use   Smoking status: Some Days    Current packs/day: 0.00    Types: Cigarettes    Last  attempt to quit: 09/09/2015    Years since quitting: 7.7   Smokeless tobacco: Never  Vaping Use   Vaping status: Never Used  Substance and Sexual Activity   Alcohol use: Yes    Alcohol/week: 10.0 standard drinks of alcohol    Types: 10 Cans of beer per week   Drug use: Not Currently    Types: Marijuana   Sexual activity: Yes  Other Topics Concern   Not on file  Social History Narrative   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   Right handed    Social Determinants of Health   Financial Resource Strain: High Risk (12/13/2022)   Overall Financial Resource Strain (CARDIA)    Difficulty of Paying Living Expenses: Hard  Food Insecurity: Food Insecurity Present (12/13/2022)   Hunger Vital Sign    Worried About Running Out of Food in the Last Year: Sometimes true    Ran Out of Food in the Last Year: Never true  Transportation Needs: Unmet Transportation Needs (12/13/2022)   PRAPARE - Administrator, Civil Service (Medical): No    Lack of Transportation (Non-Medical): Yes  Physical Activity: Insufficiently Active (12/13/2022)   Exercise Vital Sign    Days of Exercise per Week: 3 days    Minutes of Exercise per Session: 30 min  Stress: Stress Concern Present (12/13/2022)   [employer] of Occupational Health - Occupational Stress Questionnaire    Feeling of Stress : Very much  Social Connections: Moderately Isolated (12/13/2022)   Social Connection and Isolation Panel [NHANES]    Frequency of Communication with Friends and Family: Once a week    Frequency of Social Gatherings with Friends and Family: Once a week    Attends Religious Services: 1 to 4 times per year    Active Member of [employer] or Organizations: No    Attends Engineer, structural: Not on file    Marital Status: Living with partner  Intimate Partner Violence: Not on file     PHYSICAL EXAM: Vitals:   05/31/23 1357  BP: 129/86  Pulse: 96  SpO2: 100%    General: No acute distress Head:  Normocephalic/atraumatic Skin/Extremities: No rash, no edema Neurological Exam: alert and awake. No aphasia or dysarthria. Fund of knowledge is appropriate.  Attention and concentration are normal.   Cranial nerves: Pupils equal, round. Extraocular movements intact with no nystagmus. Visual fields full.  No facial  asymmetry.  Motor: Bulk and tone normal, muscle strength 5/5 throughout with no pronator drift. Reflexes +1 both UE, brisk +2 both LE. Finger to nose testing intact.  Gait narrow-based and steady, able to tandem walk adequately.    IMPRESSION: This is a pleasant 47 yo ambidextrous right hand dominant man with a history of GAD/panic disorder who presented for evaluation of upper body stiffening/shaking with no loss of consciousness. EEG normal. These appear more anxiety/stress-related, they are less frequent with good response to prn Ativan. 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 MRI cervical/thoracic spine normal. Spinal tap showed >5 OCB in both CSF and serum. We have agreed to monitor symptoms clinically and radiographically, he has opted to hold off on disease-modifying agents at this time. Headaches are not bothersome. He reports blurred near vision and will be referred for formal eye exam. He is reporting fatigue, check CBC, CMP, Vitamin B12 and D levels. Plan for interval annual brain MRI with and without contrast in 04/2024. Follow-up in 1 year, call for any changes.    Thank you for allowing me to participate in his care.  Please do not hesitate to call for any questions or concerns.    Patrcia Dolly, M.D.   CC: Dr. Allena Katz

## 2023-06-04 MED ORDER — VITAMIN D (ERGOCALCIFEROL) 1.25 MG (50000 UNIT) PO CAPS: ORAL_CAPSULE | ORAL | 0 refills | Status: DC

## 2023-06-04 NOTE — Addendum Note (Signed)
Addended by: Van Clines on: 06/04/2023 01:14 PM   Modules accepted: Orders

## 2023-06-06 ENCOUNTER — Telehealth: Payer: Self-pay | Admitting: Neurology

## 2023-06-06 NOTE — Telephone Encounter (Signed)
Patient advised of labs, voiced understanding and thanked me for calling.

## 2023-06-06 NOTE — Telephone Encounter (Signed)
Patient called back from a missed call

## 2023-08-22 ENCOUNTER — Encounter: Payer: Medicaid Other | Admitting: Internal Medicine

## 2023-10-23 ENCOUNTER — Encounter: Payer: Self-pay | Admitting: Internal Medicine

## 2023-10-23 ENCOUNTER — Ambulatory Visit (INDEPENDENT_AMBULATORY_CARE_PROVIDER_SITE_OTHER): Payer: Medicaid Other | Admitting: Internal Medicine

## 2023-10-23 VITALS — BP 111/65 | HR 85 | Ht 74.0 in | Wt 183.8 lb

## 2023-10-23 DIAGNOSIS — Z0001 Encounter for general adult medical examination with abnormal findings: Secondary | ICD-10-CM

## 2023-10-23 DIAGNOSIS — G8929 Other chronic pain: Secondary | ICD-10-CM

## 2023-10-23 DIAGNOSIS — M25512 Pain in left shoulder: Secondary | ICD-10-CM | POA: Diagnosis not present

## 2023-10-23 DIAGNOSIS — Z23 Encounter for immunization: Secondary | ICD-10-CM | POA: Diagnosis not present

## 2023-10-23 DIAGNOSIS — F411 Generalized anxiety disorder: Secondary | ICD-10-CM

## 2023-10-23 DIAGNOSIS — L239 Allergic contact dermatitis, unspecified cause: Secondary | ICD-10-CM | POA: Insufficient documentation

## 2023-10-23 DIAGNOSIS — E782 Mixed hyperlipidemia: Secondary | ICD-10-CM

## 2023-10-23 DIAGNOSIS — E538 Deficiency of other specified B group vitamins: Secondary | ICD-10-CM

## 2023-10-23 DIAGNOSIS — J309 Allergic rhinitis, unspecified: Secondary | ICD-10-CM | POA: Diagnosis not present

## 2023-10-23 DIAGNOSIS — K5904 Chronic idiopathic constipation: Secondary | ICD-10-CM

## 2023-10-23 DIAGNOSIS — G35 Multiple sclerosis: Secondary | ICD-10-CM

## 2023-10-23 DIAGNOSIS — E559 Vitamin D deficiency, unspecified: Secondary | ICD-10-CM

## 2023-10-23 MED ORDER — CETIRIZINE HCL 10 MG PO TABS
10.0000 mg | ORAL_TABLET | Freq: Every day | ORAL | 11 refills | Status: DC
Start: 2023-10-23 — End: 2024-06-18

## 2023-10-23 MED ORDER — LORAZEPAM 0.5 MG PO TABS
0.5000 mg | ORAL_TABLET | Freq: Two times a day (BID) | ORAL | 1 refills | Status: DC | PRN
Start: 2023-10-23 — End: 2024-03-23

## 2023-10-23 MED ORDER — CYANOCOBALAMIN 1000 MCG/ML IJ SOLN
1000.0000 ug | Freq: Once | INTRAMUSCULAR | Status: AC
  Administered 2023-10-23: 1000 ug via INTRAMUSCULAR

## 2023-10-23 MED ORDER — TIZANIDINE HCL 4 MG PO TABS: 4.0000 mg | ORAL_TABLET | Freq: Three times a day (TID) | ORAL | 1 refills | Status: AC | PRN

## 2023-10-23 NOTE — Assessment & Plan Note (Addendum)
 Reports remote history of left shoulder injury Unclear etiology currently, could be rotator cuff injury and/or biceps tendonitis Zanaflex as needed for muscle spasm/stiffness Tylenol or ibuprofen as needed for pain Had been evaluated by orthopedic surgery

## 2023-10-23 NOTE — Assessment & Plan Note (Signed)
Advised to take Colace or senna PRN Increase fluid intake

## 2023-10-23 NOTE — Assessment & Plan Note (Signed)
 Physical exam as documented. Fasting blood tests today. Denies flu vaccine. Tdap vaccine today.  Wants to think about PCV20 vaccine - information provided.

## 2023-10-23 NOTE — Progress Notes (Signed)
 Established Patient Office Visit  Subjective:  Patient ID: Benjamin Waters, male    DOB: 1976/05/03  Age: 48 y.o. MRN: [id number]  CC:  Chief Complaint  Patient presents with   Annual Exam    Cpe and 4 month f/u   Skin Problem    Pt has psoriasis concerns, in elbows.     HPI Benjamin Waters is a 48 y.o. male with past medical history of GAD, MS and allergic sinusitis who presents for annual physical.  He has history of panic episodes, but does not recall any triggers.  He denies any dyspnea during the episode, but feels enclosed in a space, and has headache, chest pain, diffuse muscle aches and blurry vision. He also reports clenching of his hands and jerking of UE.  He takes Ativan as needed for panic episodes.   He has been followed by neurologist for intermittent jerking movements, numbness and weakness of the facial area and weakness of RUE and RLE.  He has been diagnosed with MS, but he prefers not to start any treatment for now.  He had visual disturbance, had ophthalmology evaluation and was advised to use reading glasses.  He reports recurrent nasal sores.  He has stopped trimming nasal hair with scissor now.  He has been using Flonase for allergic sinusitis.  He has noticed redness inside the nostrils and intermittent mild oozing of blood.  Denies any fever or chills currently.  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 No pertinent surgical history.  Family History  Problem Relation Age of Onset   Lung cancer Father    Hypertension Sister    Cancer Sister    Drug abuse Sister     Social History   Socioeconomic History   Marital status: Media planner    Spouse name: Not on file   Number of children: Not on file   Years of education: Not on file   Highest education level: Some college, no degree  Occupational History   Not on file  Tobacco Use   Smoking status: Some Days    Current  packs/day: 0.00    Types: Cigarettes    Last attempt to quit: 09/09/2015    Years since quitting: 8.1   Smokeless tobacco: Never  Vaping Use   Vaping status: Never Used  Substance and Sexual Activity   Alcohol use: Yes    Alcohol/week: 10.0 standard drinks of alcohol    Types: 10 Cans of beer per week   Drug use: Not Currently    Types: Marijuana   Sexual activity: Yes  Other Topics Concern   Not on file  Social History Narrative   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   Right handed    Social Drivers of Health   Financial Resource Strain: High Risk (10/19/2023)   Overall Financial Resource Strain (CARDIA)    Difficulty of Paying Living Expenses: Hard  Food Insecurity: Food Insecurity Present (10/19/2023)   Hunger Vital Sign    Worried About Running Out of Food in the Last Year: Sometimes true    Ran Out of Food in the Last Year: Never true  Transportation Needs: No Transportation Needs (10/19/2023)   PRAPARE - Administrator, Civil Service (Medical): No    Lack of Transportation (Non-Medical): No  Physical Activity: Insufficiently Active (10/19/2023)   Exercise Vital Sign    Days of Exercise  per Week: 3 days    Minutes of Exercise per Session: 30 min  Stress: Stress Concern Present (10/19/2023)   [employer] of Occupational Health - Occupational Stress Questionnaire    Feeling of Stress : Very much  Social Connections: Moderately Integrated (10/19/2023)   Social Connection and Isolation Panel [NHANES]    Frequency of Communication with Friends and Family: Twice a week    Frequency of Social Gatherings with Friends and Family: Once a week    Attends Religious Services: 1 to 4 times per year    Active Member of [employer] or Organizations: No    Attends Engineer, structural: Not on file    Marital Status: Living with partner  Intimate Partner Violence: Not on file    Outpatient Medications Prior to Visit   Medication Sig Dispense Refill   fluticasone (FLONASE) 50 MCG/ACT nasal spray Place 2 sprays into both nostrils daily. 16 g 5   mupirocin ointment (BACTROBAN) 2 % Apply 1 Application topically 2 (two) times daily. 22 g 0   sodium chloride (OCEAN) 0.65 % nasal spray Place 1 spray into the nose as needed for congestion. 30 mL 12   Vitamin D, Ergocalciferol, (DRISDOL) 1.25 MG (50000 UNIT) CAPS capsule Take 1 capsule once a week for 8 weeks 8 capsule 0   cetirizine (ZYRTEC) 10 MG tablet Take 1 tablet by mouth once daily 30 tablet 11   LORazepam (ATIVAN) 0.5 MG tablet Take 1 tablet by mouth twice daily as needed for anxiety 30 tablet 1   tiZANidine (ZANAFLEX) 4 MG tablet Take 1 tablet (4 mg total) by mouth every 8 (eight) hours as needed for muscle spasms. 30 tablet 1   No facility-administered medications prior to visit.    No Known Allergies  ROS Review of Systems  Constitutional:  Negative for chills and fever.  HENT:  Positive for congestion and postnasal drip.   Eyes:  Positive for visual disturbance. Negative for pain and discharge.  Respiratory:  Negative for cough and shortness of breath.   Cardiovascular:  Negative for chest pain and palpitations.  Gastrointestinal:  Positive for constipation. Negative for diarrhea, nausea and vomiting.  Endocrine: Negative for polydipsia and polyuria.  Genitourinary:  Negative for dysuria and hematuria.  Musculoskeletal:  Negative for neck pain and neck stiffness.  Skin:  Negative for rash.  Allergic/Immunologic: Positive for environmental allergies.  Neurological:  Positive for weakness and headaches. Negative for numbness.  Psychiatric/Behavioral:  Negative for agitation and behavioral problems. The patient is nervous/anxious.       Objective:    Physical Exam Vitals reviewed.  Constitutional:      General: He is not in acute distress.    Appearance: He is not diaphoretic.  HENT:     Head: Normocephalic and atraumatic.     Nose:  Mucosal edema and congestion present.     Right Nostril: No epistaxis.     Left Nostril: No epistaxis.     Right Sinus: No frontal sinus tenderness.     Left Sinus: No frontal sinus tenderness.     Mouth/Throat:     Mouth: Mucous membranes are moist.  Eyes:     General: No scleral icterus.    Extraocular Movements: Extraocular movements intact.  Cardiovascular:     Rate and Rhythm: Normal rate and regular rhythm.     Heart sounds: Normal heart sounds. No murmur heard. Pulmonary:     Breath sounds: Normal breath sounds. No wheezing or rales.  Abdominal:  Palpations: Abdomen is soft.     Tenderness: There is no abdominal tenderness.  Musculoskeletal:     Cervical back: Neck supple. No tenderness.     Right lower leg: No edema.     Left lower leg: No edema.  Skin:    General: Skin is warm.     Findings: Erythema (Mild, over b/l elbows) present. No rash.  Neurological:     General: No focal deficit present.     Mental Status: He is alert and oriented to person, place, and time.     Cranial Nerves: No cranial nerve deficit.     Sensory: No sensory deficit.     Motor: No weakness.  Psychiatric:        Mood and Affect: Mood normal.        Behavior: Behavior normal.     BP 111/65   Pulse 85   Ht 6\' 2"  (1.88 m)   Wt 183 lb 12.8 oz (83.4 kg)   SpO2 94%   BMI 23.60 kg/m  Wt Readings from Last 3 Encounters:  10/23/23 183 lb 12.8 oz (83.4 kg)  05/31/23 193 lb 6.4 oz (87.7 kg)  04/19/23 195 lb 9.6 oz (88.7 kg)    Lab Results  Component Value Date   TSH 2.410 06/08/2022   Lab Results  Component Value Date   WBC 8.2 05/31/2023   HGB 15.2 05/31/2023   HCT 44.2 05/31/2023   MCV 93.6 05/31/2023   PLT 218.0 05/31/2023   Lab Results  Component Value Date   NA 142 05/31/2023   K 4.7 05/31/2023   CO2 33 (H) 05/31/2023   GLUCOSE 64 (L) 05/31/2023   BUN 13 05/31/2023   CREATININE 1.16 05/31/2023   BILITOT 0.5 05/31/2023   ALKPHOS 71 05/31/2023   AST 16 05/31/2023    ALT 19 05/31/2023   PROT 7.0 05/31/2023   ALBUMIN 4.4 05/31/2023   CALCIUM 9.7 05/31/2023   EGFR 93 06/08/2022   GFR 75.84 05/31/2023   Lab Results  Component Value Date   CHOL 132 06/08/2022   Lab Results  Component Value Date   HDL 27 (L) 06/08/2022   Lab Results  Component Value Date   LDLCALC 76 06/08/2022   Lab Results  Component Value Date   TRIG 168 (H) 06/08/2022   Lab Results  Component Value Date   CHOLHDL 4.9 06/08/2022   Lab Results  Component Value Date   HGBA1C 5.3 06/08/2022      Assessment & Plan:   Problem List Items Addressed This Visit       Respiratory   Allergic sinusitis   Takes Zyrtec 10 mg PRN Has Flonase      Relevant Medications   cetirizine (ZYRTEC) 10 MG tablet     Digestive   Chronic idiopathic constipation   Advised to take Colace or senna PRN Increase fluid intake        Nervous and Auditory   MS (multiple sclerosis) (HCC)   Followed by neurology Not on any treatment currently, gets surveillance MRI - he has Neurology f/u Has intermittent neurologic symptoms, but some of them thought to be related to anxiety as well      Relevant Orders   TSH + free T4     Musculoskeletal and Integument   Allergic contact dermatitis   Erythematous patches over bilateral elbows, responded well to lotion - do not appear Psoriatic currently Advised to continue to use lotion or moisturizer to avoid dry skin  Other   GAD (generalized anxiety disorder)   Likely panic episodes - Ativan PRN Overall well-controlled Did not tolerate SSRI in the past Uses marijuana at times, strongly advised to avoid marijuana use as he is taking BZD      Relevant Medications   LORazepam (ATIVAN) 0.5 MG tablet   Other Relevant Orders   TSH + free T4   Left shoulder pain   Reports remote history of left shoulder injury Unclear etiology currently, could be rotator cuff injury and/or biceps tendonitis Zanaflex as needed for muscle  spasm/stiffness Tylenol or ibuprofen as needed for pain Had been evaluated by orthopedic surgery      Relevant Medications   tiZANidine (ZANAFLEX) 4 MG tablet   Encounter for general adult medical examination with abnormal findings - Primary   Physical exam as documented. Fasting blood tests today. Denies flu vaccine. Tdap vaccine today.  Wants to think about PCV20 vaccine - information provided.      Other Visit Diagnoses       Vitamin D deficiency       Relevant Orders   Vitamin D (25 hydroxy)     Mixed hyperlipidemia       Relevant Orders   Lipid Profile     Encounter for immunization       Relevant Orders   Tdap vaccine greater than or equal to 7yo IM (Completed)     B12 deficiency       Relevant Medications   cyanocobalamin (VITAMIN B12) injection 1,000 mcg (Completed) (Start on 10/23/2023 10:30 AM)        Meds ordered this encounter  Medications   cetirizine (ZYRTEC) 10 MG tablet    Sig: Take 1 tablet (10 mg total) by mouth daily.    Dispense:  30 tablet    Refill:  11   LORazepam (ATIVAN) 0.5 MG tablet    Sig: Take 1 tablet (0.5 mg total) by mouth 2 (two) times daily as needed. for anxiety    Dispense:  30 tablet    Refill:  1   tiZANidine (ZANAFLEX) 4 MG tablet    Sig: Take 1 tablet (4 mg total) by mouth every 8 (eight) hours as needed for muscle spasms.    Dispense:  30 tablet    Refill:  1   cyanocobalamin (VITAMIN B12) injection 1,000 mcg    Follow-up: Return in about 6 months (around 04/24/2024) for GAD.    Anabel Halon, MD

## 2023-10-23 NOTE — Assessment & Plan Note (Signed)
 Followed by neurology Not on any treatment currently, gets surveillance MRI - he has Neurology f/u Has intermittent neurologic symptoms, but some of them thought to be related to anxiety as well

## 2023-10-23 NOTE — Patient Instructions (Addendum)
 Please continue to take medications as prescribed.  Please continue to follow heart healthy diet and perform moderate exercise/walking at least 150 mins/week.  Please consider getting Pneumococcal-20 vaccine.

## 2023-10-23 NOTE — Assessment & Plan Note (Signed)
 Erythematous patches over bilateral elbows, responded well to lotion - do not appear Psoriatic currently Advised to continue to use lotion or moisturizer to avoid dry skin

## 2023-10-23 NOTE — Assessment & Plan Note (Addendum)
 Likely panic episodes - Ativan PRN Overall well-controlled Did not tolerate SSRI in the past Uses marijuana at times, strongly advised to avoid marijuana use as he is taking BZD

## 2023-10-23 NOTE — Assessment & Plan Note (Signed)
Takes Zyrtec 10 mg PRN Has Flonase

## 2023-10-24 LAB — LIPID PANEL
Chol/HDL Ratio: 4.3 ratio (ref 0.0–5.0)
Cholesterol, Total: 133 mg/dL (ref 100–199)
HDL: 31 mg/dL — ABNORMAL LOW (ref >39)
LDL Chol Calc (NIH): 77 mg/dL (ref 0–99)
Triglycerides: 140 mg/dL (ref 0–149)
VLDL Cholesterol Cal: 25 mg/dL (ref 5–40)

## 2023-10-24 LAB — TSH+FREE T4
Free T4: 1.22 ng/dL (ref 0.82–1.77)
TSH: 4.07 u[IU]/mL (ref 0.450–4.500)

## 2023-10-24 LAB — VITAMIN D 25 HYDROXY (VIT D DEFICIENCY, FRACTURES): Vit D, 25-Hydroxy: 30.6 ng/mL (ref 30.0–100.0)

## 2024-03-23 ENCOUNTER — Other Ambulatory Visit: Payer: Self-pay | Admitting: Internal Medicine

## 2024-03-23 DIAGNOSIS — F411 Generalized anxiety disorder: Secondary | ICD-10-CM

## 2024-04-24 ENCOUNTER — Ambulatory Visit: Admitting: Internal Medicine

## 2024-05-05 ENCOUNTER — Encounter: Payer: Self-pay | Admitting: Internal Medicine

## 2024-06-01 ENCOUNTER — Other Ambulatory Visit: Payer: Self-pay | Admitting: Neurology

## 2024-06-01 ENCOUNTER — Ambulatory Visit: Payer: Medicaid Other | Admitting: Neurology

## 2024-06-01 ENCOUNTER — Other Ambulatory Visit

## 2024-06-01 ENCOUNTER — Encounter: Payer: Self-pay | Admitting: Neurology

## 2024-06-01 VITALS — BP 117/76 | HR 55 | Ht 74.0 in | Wt 184.6 lb

## 2024-06-01 DIAGNOSIS — G629 Polyneuropathy, unspecified: Secondary | ICD-10-CM

## 2024-06-01 DIAGNOSIS — R2689 Other abnormalities of gait and mobility: Secondary | ICD-10-CM | POA: Diagnosis not present

## 2024-06-01 DIAGNOSIS — G35D Multiple sclerosis, unspecified: Secondary | ICD-10-CM

## 2024-06-01 LAB — VITAMIN B12: Vitamin B-12: 390 pg/mL (ref 200–1100)

## 2024-06-01 NOTE — Progress Notes (Addendum)
 NEUROLOGY FOLLOW UP OFFICE NOTE  Benjamin Waters [id number] [birth year]  HISTORY OF PRESENT ILLNESS: I had the pleasure of seeing Benjamin Waters in follow-up in the neurology clinic on 06/01/2024.  The patient was last seen a year ago. He is alone in the office today. Records and images were personally reviewed where available. He was initially seen in 05/2021 for episodes of upper body stiffening with low amplitude shaking. EEG was normal in 06/2020. As part of his workup, he had a brain MRI which showed changes concerning for demyelinating disease. Repeat brain MRI with and without contrast done 10/10/2021 did not show any enhancing lesions seen, no changes in the white matter changes seen bilaterally,in the juxtacortical regions, and largest in the left periventricular region. There were no spinal cord lesions on cervical/thoracic MRI. LP showed  >5 well defined gamma restriction bands in CSF with additional identical gamma restriction bands in the CSF and serum, indicative of intra-cerebral as well as systemic synthesis of gammaglobulins. CNS IgG synthesis elevated 18.3, IgG index elevated 1.94. His last brain MRI done 04/2023 was stable, no acute changes seen.  He reported fatigue on last visit, vitamin D  level was low at 25.64, he was advised to take high dose vitamin D  once a week for 8 weeks. B12 level was 270, normal CBC and CMP.  Repeat vitamin D  by PCP on 10/2023 was 30.6. He reports symptoms overall stable. The main thing he has noticed is lightheadedness with positional changes. He feels his balance is off, especially when working outside changing positions. No falls. He has numbness and tingling in his toes and fingers. He has chronic neck and back pain. No bowel/bladder dysfunction. He has tinnitus in both ears, last hearing evaluation was in 2002. His vision is blurred, on eye exam 6 months ago, he was told he needs readers. He reports the shaking/stiffening can be prevented by taking Ativan .  As soon as he feels it coming on, he would take the medication and it stops progression to shaking. He has found that he has to stop what he is doing and take Ativan  or symptoms will keep on going.    History On Initial Assessment 06/05/2021: This is a pleasant 48 year old ambidextrous right hand dominant man with a history of GAD/panic disorder, presenting for evaluation of seizure-like activity. He started having intermittent episodes around 5 years ago, that became progressively worse that they were occurring daily close to 2 years ago. He can sometimes tell when they would start, he would feel weird with a physical sensation that something is wrong with him. Sometimes there is a pressure on the left side of his chest and he feels his heart beating fast or irregularly. He has a wrist BP cuff at home and it would show an irregular rhythm. His upper body would then stiffen/contract with low amplitude shaking. His fists may clench. He has to consciously focus on his breathing and relax his body, which may or may not help. There would be no loss of consciousness but a few times he felt like he would pass out when he had gotten lightheaded. There was one time a few days ago where he had a weird taste in his mouth like metal and he noticed he was feeling nervous/anxious with sweat on his face. He attributed to the taste to shaving cream in his mouth. He was prescribed Ativan  close to 2 years ago which has helped a lot. Episodes are not occurring daily, he has around 1-2  a month. No nocturnal episodes. He is able to catch it now and take the Ativan , if he does not take it soon enough, he takes an additional dose. In the past 3 weeks, he has taken 10-14 tablets. He notices random muscle twitches on his chest, thumb, or different parts of his body. He injured his left shoulder which also causes some anxiety. He lives with his partner, 11 year old son, and 11yo nephew, and has not been told of any staring/unresponsive  episodes. He denies any loss of time. He denies any significant headaches, dizziness, diplopia, dysarthria/dysphagia, bladder dysfunction. He has neck/back pain and constipation.   He reports an incident in 2008 when he woke up with the right side of his mouth paralyzed. He went to Urgent Care where they thought it was Bell's palsy and prescribed Prednisone. Over the next 1-2 days, he had progressive weakness on the right arm then right leg where he needed to pull his right leg up or use his left arm to lift the right. He could move his right hand but there was no feeling in it, he could not write with it. It took 3 months to improve, and was normal in 6 months. He still notices a little droop on the right side of his mouth when smiling and coordination on the right hand is not as good, but feels 98% better. He has occasional numbness and tingling in his fingers and toes. No falls. Sleep is good. Mood depends on the day, he reports being very stressed. He works in Airline pilot. His cousin had a brain tumor and seizure. Otherwise he had a normal birth and early development.  There is no history of febrile convulsions, CNS infections such as meningitis/encephalitis, significant traumatic brain injuries, neurosurgical procedures.   Diagnostic Data: MRI brain without contrast done 06/2021 did not show any acute changes. There were multiple white matter changes seen in the periventricular, deep, and juxtacortical regions with a dominant 1.9cm lesion involving the periventricular white matter at the posterior left frontal region. Few of the foci radiate from the lateral ventricles in a perpendicular fashion, with few corresponding T1 black holes.   Cervical/thoracic MRI done 09/2021: No spinal lesions seen.   Lumbar puncture 07/2021: CSF showed a  WBC of 7 with 93 lymphs, normal protein 32 and glucose 57. Culture was negative, no malignant cells, ACE 3. There were >5 well defined gamma restriction bands in CSF with  additional identical gamma restriction bands in the CSF and serum, indicative of intra-cerebral as well as systemic synthesis of gammaglobulins. CNS IgG synthesis elevated 18.3, IgG index elevated 1.94.   Normal wake and sleep EEG in 06/2021.   Bloodwork done was negative for autoimmune workup, his B12 level was 176, he has started B12 injections.  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 30 tablet 11   fluticasone  (FLONASE ) 50 MCG/ACT nasal spray Place 2 sprays into both nostrils daily. 16 g 5   LORazepam  (ATIVAN ) 0.5 MG tablet Take 1 tablet by mouth twice daily as needed for anxiety 30 tablet 1   mupirocin  ointment (BACTROBAN ) 2 % Apply 1 Application topically 2 (two) times daily. 22 g 0   sodium chloride  (OCEAN) 0.65 % nasal spray Place 1  spray into the nose as needed for congestion. 30 mL 12   tiZANidine  (ZANAFLEX ) 4 MG tablet Take 1 tablet (4 mg total) by mouth every 8 (eight) hours as needed for muscle spasms. 30 tablet 1   Vitamin D , Ergocalciferol , (DRISDOL ) 1.25 MG (50000 UNIT) CAPS capsule Take 1 capsule once a week for 8 weeks 8 capsule 0   No current facility-administered medications on file prior to visit.    ALLERGIES: No Known Allergies  FAMILY HISTORY: Family History  Problem Relation Age of Onset   Lung cancer Father    Hypertension Sister    Cancer Sister    Drug abuse Sister     SOCIAL HISTORY: Social History   Socioeconomic History   Marital status: Media planner    Spouse name: Not on file   Number of children: Not on file   Years of education: Not on file   Highest education level: Some college, no degree  Occupational History   Not on file  Tobacco Use   Smoking status: Some Days    Current  packs/day: 0.00    Types: Cigarettes    Last attempt to quit: 09/09/2015    Years since quitting: 8.7   Smokeless tobacco: Never  Vaping Use   Vaping status: Never Used  Substance and Sexual Activity   Alcohol use: Yes    Alcohol/week: 10.0 standard drinks of alcohol    Types: 10 Cans of beer per week   Drug use: Not Currently    Types: Marijuana   Sexual activity: Yes  Other Topics Concern   Not on file  Social History Narrative   Dola of 11 years.Lives with fiancee.Currently unemployed.Thinking of going into IT.   Right handed    Social Drivers of Health   Financial Resource Strain: High Risk (10/19/2023)   Overall Financial Resource Strain (CARDIA)    Difficulty of Paying Living Expenses: Hard  Food Insecurity: Food Insecurity Present (10/19/2023)   Hunger Vital Sign    Worried About Running Out of Food in the Last Year: Sometimes true    Ran Out of Food in the Last Year: Never true  Transportation Needs: No Transportation Needs (10/19/2023)   PRAPARE - Administrator, Civil Service (Medical): No    Lack of Transportation (Non-Medical): No  Physical Activity: Insufficiently Active (10/19/2023)   Exercise Vital Sign    Days of Exercise per Week: 3 days    Minutes of Exercise per Session: 30 min  Stress: Stress Concern Present (10/19/2023)   [employer] of Occupational Health - Occupational Stress Questionnaire    Feeling of Stress : Very much  Social Connections: Moderately Integrated (10/19/2023)   Social Connection and Isolation Panel    Frequency of Communication with Friends and Family: Twice a week    Frequency of Social Gatherings with Friends and Family: Once a week    Attends Religious Services: 1 to 4 times per year    Active Member of [employer] or Organizations: No    Attends Engineer, structural: Not on file    Marital Status: Living with partner  Intimate Partner Violence: Not on file     PHYSICAL EXAM: Vitals:   06/01/24 0954  BP:  117/76  Pulse: (!) 55  SpO2: 99%   General: No acute distress Head:  Normocephalic/atraumatic Skin/Extremities: No rash, no edema Neurological Exam: alert and awake. No aphasia or dysarthria. Fund of knowledge is appropriate.  Attention and concentration are normal.   Cranial nerves:  Pupils equal, round. Extraocular movements intact with no nystagmus. Visual fields full.  No facial asymmetry.  Motor: Bulk and tone normal, muscle strength 5/5 throughout with no pronator drift. Sensation intact to all modalities on both UE, decreased cold below ankles, decreased vibration sense to knees bilaterally, R>L, reports increased pin around left ankle. Reflexes brisk +2 throughout except for +1 left ankle. No clonus, toes downgoing.  Finger to nose testing intact.  Gait narrow-based and steady, able to tandem walk adequately.  Romberg negative.   IMPRESSION: This is a pleasant 48 yo ambidextrous right hand dominant man with a history of GAD/panic disorder who presented for evaluation of upper body stiffening/shaking with no loss of consciousness. EEG normal. These are overall stable and respond to Ativan , no staring/unresponsiveness. They appear to be more anxiety/stress-related. 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 He has opted to hold off on disease-modifying agents unless there is evidence of progression on imaging. He reports lightheadedness and balance changes, exam does show mild neuropathy, however we will proceed with interval annual brain MRI with and without contrast. Check B12 level, if <400, recommend daily supplement. Continue follow-up with eye doctor. Follow-up in 1 year or earlier if needed, call for any changes.   Thank you for allowing me to participate in his care.  Please do not hesitate to call for any questions or concerns.    Darice Shivers, M.D.   CC: Dr.  Tobie

## 2024-06-01 NOTE — Patient Instructions (Signed)
 Good to see you!  Have bloodwork done for B12 level  2. Schedule MRI brain with and without contrast  3. Continue with staying hydrated, monitor BP if able during periods of lightheadedness  4. Follow-up in 1 year or earlier if needed, call for any changes

## 2024-06-03 ENCOUNTER — Ambulatory Visit: Payer: Self-pay | Admitting: Neurology

## 2024-06-03 NOTE — Telephone Encounter (Signed)
 Pt called an informed vitamin B12 level is not bad, it is 390 within range, but as we discussed, when patients have neurological symptoms, we would like it above 400. Recommend starting a daily vitamin B12 500 mcg supplement.

## 2024-06-03 NOTE — Telephone Encounter (Signed)
-----   Message from Darice CHRISTELLA Shivers sent at 06/03/2024  9:41 AM EDT ----- Pls let him know his vitamin B12 level is not bad, it is 390 within range, but as we discussed, when patients have neurological symptoms, we would like it above 400. Recommend starting a daily  vitamin B12 500 mcg supplement. Thanks ----- Message ----- From: Rebecka Hose Lab Results In Sent: 06/01/2024  11:12 PM EDT To: Darice CHRISTELLA Shivers, MD

## 2024-06-18 ENCOUNTER — Ambulatory Visit (INDEPENDENT_AMBULATORY_CARE_PROVIDER_SITE_OTHER): Payer: Self-pay | Admitting: Internal Medicine

## 2024-06-18 ENCOUNTER — Encounter: Payer: Self-pay | Admitting: Internal Medicine

## 2024-06-18 VITALS — BP 114/78 | HR 73 | Ht 74.0 in | Wt 182.0 lb

## 2024-06-18 DIAGNOSIS — F411 Generalized anxiety disorder: Secondary | ICD-10-CM

## 2024-06-18 DIAGNOSIS — G35D Multiple sclerosis, unspecified: Secondary | ICD-10-CM | POA: Diagnosis not present

## 2024-06-18 DIAGNOSIS — J309 Allergic rhinitis, unspecified: Secondary | ICD-10-CM | POA: Diagnosis not present

## 2024-06-18 DIAGNOSIS — M79641 Pain in right hand: Secondary | ICD-10-CM | POA: Diagnosis not present

## 2024-06-18 MED ORDER — LORAZEPAM 0.5 MG PO TABS
0.5000 mg | ORAL_TABLET | Freq: Two times a day (BID) | ORAL | 1 refills | Status: AC | PRN
Start: 2024-06-18 — End: ?

## 2024-06-18 MED ORDER — CETIRIZINE HCL 10 MG PO TABS: 10.0000 mg | ORAL_TABLET | Freq: Every day | ORAL | 11 refills | Status: AC

## 2024-06-18 NOTE — Assessment & Plan Note (Addendum)
 Likely panic episodes - Ativan  PRN, PDMP reviewed, refilled Overall well-controlled Did not tolerate SSRI in the past Uses marijuana at times, strongly advised to avoid marijuana use as he is taking BZD

## 2024-06-18 NOTE — Assessment & Plan Note (Signed)
 Followed by neurology Not on any treatment currently, gets surveillance MRI - he has Neurology f/u Has intermittent neurologic symptoms, but some of them thought to be related to anxiety as well

## 2024-06-18 NOTE — Assessment & Plan Note (Signed)
 Right hand pain and stiffness - check x-ray of right hand If signs of degenerative changes or erosion noted, will obtain initial autoimmune workup Advised to apply Voltaren gel as needed for pain Advised to perform simple hand exercises

## 2024-06-18 NOTE — Patient Instructions (Signed)
 Please continue to take medications as prescribed.  Please continue to follow heart healthy diet and perform moderate exercise/walking at least 150 mins/week.

## 2024-06-18 NOTE — Assessment & Plan Note (Signed)
Takes Zyrtec 10 mg PRN Has Flonase

## 2024-06-18 NOTE — Progress Notes (Signed)
 Established Patient Office Visit  Subjective:  Patient ID: Benjamin Waters, male    DOB: 09-Jul-1976  Age: 48 y.o. MRN: [id number]  CC:  Chief Complaint  Patient presents with   Anxiety   Hand Pain    Finger pain concerns about arthritis.     HPI Benjamin Waters is a 48 y.o. male with past medical history of GAD, MS and allergic sinusitis who presents for f/u of his chronic medical conditions.  He has history of panic episodes, but does not recall any triggers.  He denies any dyspnea during the episode, but feels enclosed in a space, and has headache, chest pain, diffuse muscle aches and blurry vision. He also reports clenching of his hands and jerking of UE.  He takes Ativan  as needed for panic episodes.  He has been followed by neurologist for intermittent jerking movements, numbness and weakness of the facial area and weakness of RUE and RLE.  He has been diagnosed with MS, but he prefers not to start any treatment for now.  He had visual disturbance, had ophthalmology evaluation and was advised to use reading glasses.  He has Flonase  for allergic sinusitis.  He has noticed redness inside the nostrils and intermittent mild oozing of blood in the past.  Denies any fever or chills currently.  He reports right hand pain and stiffness, worse in the morning.  He has noticed pain around DIP joints of the fingers, especially middle finger.  Denies any recent injury.  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 No pertinent surgical history.  Family History  Problem Relation Age of Onset   Lung cancer Father    Hypertension Sister    Cancer Sister    Drug abuse Sister     Social History   Socioeconomic History   Marital status: Media Planner    Spouse name: Not on file   Number of children: Not on file   Years of education: Not on file   Highest education level: Associate degree: occupational, scientist, product/process development,  or vocational program  Occupational History   Not on file  Tobacco Use   Smoking status: Some Days    Current packs/day: 0.00    Types: Cigarettes    Last attempt to quit: 09/09/2015    Years since quitting: 8.7   Smokeless tobacco: Never  Vaping Use   Vaping status: Never Used  Substance and Sexual Activity   Alcohol use: Yes    Alcohol/week: 10.0 standard drinks of alcohol    Types: 10 Cans of beer per week   Drug use: Not Currently    Types: Marijuana   Sexual activity: Yes  Other Topics Concern   Not on file  Social History Narrative   Dola of 11 years.Lives with fiancee.Currently unemployed.Thinking of going into IT.   Right handed    Social Drivers of Health   Financial Resource Strain: High Risk (06/12/2024)   Overall Financial Resource Strain (CARDIA)    Difficulty of Paying Living Expenses: Very hard  Food Insecurity: Food Insecurity Present (06/12/2024)   Hunger Vital Sign    Worried About Running Out of Food in the Last Year: Sometimes true    Ran Out of Food in the Last Year: Never true  Transportation Needs: No Transportation Needs (06/12/2024)   PRAPARE - Administrator, Civil Service (Medical): No    Lack of Transportation (Non-Medical): No  Physical Activity: Insufficiently  Active (06/12/2024)   Exercise Vital Sign    Days of Exercise per Week: 4 days    Minutes of Exercise per Session: 30 min  Stress: Stress Concern Present (06/12/2024)   [employer] of Occupational Health - Occupational Stress Questionnaire    Feeling of Stress: Rather much  Social Connections: Moderately Isolated (06/12/2024)   Social Connection and Isolation Panel    Frequency of Communication with Friends and Family: Once a week    Frequency of Social Gatherings with Friends and Family: Once a week    Attends Religious Services: 1 to 4 times per year    Active Member of [employer] or Organizations: No    Attends Engineer, Structural: Not on file     Marital Status: Living with partner  Intimate Partner Violence: Not on file    Outpatient Medications Prior to Visit  Medication Sig Dispense Refill   fluticasone  (FLONASE ) 50 MCG/ACT nasal spray Place 2 sprays into both nostrils daily. 16 g 5   mupirocin  ointment (BACTROBAN ) 2 % Apply 1 Application topically 2 (two) times daily. 22 g 0   sodium chloride  (OCEAN) 0.65 % nasal spray Place 1 spray into the nose as needed for congestion. 30 mL 12   tiZANidine  (ZANAFLEX ) 4 MG tablet Take 1 tablet (4 mg total) by mouth every 8 (eight) hours as needed for muscle spasms. 30 tablet 1   cetirizine  (ZYRTEC ) 10 MG tablet Take 1 tablet (10 mg total) by mouth daily. 30 tablet 11   LORazepam  (ATIVAN ) 0.5 MG tablet Take 1 tablet by mouth twice daily as needed for anxiety 30 tablet 1   Vitamin D , Ergocalciferol , (DRISDOL ) 1.25 MG (50000 UNIT) CAPS capsule Take 1 capsule once a week for 8 weeks 8 capsule 0   No facility-administered medications prior to visit.    No Known Allergies  ROS Review of Systems  Constitutional:  Negative for chills and fever.  HENT:  Positive for congestion and postnasal drip.   Eyes:  Positive for visual disturbance. Negative for pain and discharge.  Respiratory:  Negative for cough and shortness of breath.   Cardiovascular:  Negative for chest pain and palpitations.  Gastrointestinal:  Positive for constipation. Negative for diarrhea, nausea and vomiting.  Endocrine: Negative for polydipsia and polyuria.  Genitourinary:  Negative for dysuria and hematuria.  Musculoskeletal:  Negative for neck pain and neck stiffness.       Right hand pain  Skin:  Negative for rash.  Allergic/Immunologic: Positive for environmental allergies.  Neurological:  Positive for weakness and headaches. Negative for numbness.  Psychiatric/Behavioral:  Negative for agitation and behavioral problems. The patient is nervous/anxious.       Objective:    Physical Exam Vitals reviewed.   Constitutional:      General: He is not in acute distress.    Appearance: He is not diaphoretic.  HENT:     Head: Normocephalic and atraumatic.     Nose: Mucosal edema and congestion present.     Right Nostril: No epistaxis.     Left Nostril: No epistaxis.     Right Sinus: No frontal sinus tenderness.     Left Sinus: No frontal sinus tenderness.     Mouth/Throat:     Mouth: Mucous membranes are moist.  Eyes:     General: No scleral icterus.    Extraocular Movements: Extraocular movements intact.  Cardiovascular:     Rate and Rhythm: Normal rate and regular rhythm.     Heart sounds: Normal  heart sounds. No murmur heard. Pulmonary:     Breath sounds: Normal breath sounds. No wheezing or rales.  Musculoskeletal:     Right hand: Tenderness (Mild - DIP joint of middle finger) present. Normal sensation.     Cervical back: Neck supple. No tenderness.     Right lower leg: No edema.     Left lower leg: No edema.  Skin:    General: Skin is warm.     Findings: No rash.  Neurological:     General: No focal deficit present.     Mental Status: He is alert and oriented to person, place, and time.  Psychiatric:        Mood and Affect: Mood normal.        Behavior: Behavior normal.     BP 114/78   Pulse 73   Ht 6' 2 (1.88 m)   Wt 182 lb (82.6 kg)   SpO2 98%   BMI 23.37 kg/m  Wt Readings from Last 3 Encounters:  06/18/24 182 lb (82.6 kg)  06/01/24 184 lb 9.6 oz (83.7 kg)  10/23/23 183 lb 12.8 oz (83.4 kg)    Lab Results  Component Value Date   TSH 4.070 10/23/2023   Lab Results  Component Value Date   WBC 8.2 05/31/2023   HGB 15.2 05/31/2023   HCT 44.2 05/31/2023   MCV 93.6 05/31/2023   PLT 218.0 05/31/2023   Lab Results  Component Value Date   NA 142 05/31/2023   K 4.7 05/31/2023   CO2 33 (H) 05/31/2023   GLUCOSE 64 (L) 05/31/2023   BUN 13 05/31/2023   CREATININE 1.16 05/31/2023   BILITOT 0.5 05/31/2023   ALKPHOS 71 05/31/2023   AST 16 05/31/2023   ALT 19  05/31/2023   PROT 7.0 05/31/2023   ALBUMIN 4.4 05/31/2023   CALCIUM 9.7 05/31/2023   EGFR 93 06/08/2022   GFR 75.84 05/31/2023   Lab Results  Component Value Date   CHOL 133 10/23/2023   Lab Results  Component Value Date   HDL 31 (L) 10/23/2023   Lab Results  Component Value Date   LDLCALC 77 10/23/2023   Lab Results  Component Value Date   TRIG 140 10/23/2023   Lab Results  Component Value Date   CHOLHDL 4.3 10/23/2023   Lab Results  Component Value Date   HGBA1C 5.3 06/08/2022      Assessment & Plan:   Problem List Items Addressed This Visit       Respiratory   Allergic sinusitis   Takes Zyrtec  10 mg PRN Has Flonase       Relevant Medications   cetirizine  (ZYRTEC ) 10 MG tablet     Nervous and Auditory   MS (multiple sclerosis)   Followed by neurology Not on any treatment currently, gets surveillance MRI - he has Neurology f/u Has intermittent neurologic symptoms, but some of them thought to be related to anxiety as well        Other   GAD (generalized anxiety disorder) - Primary   Likely panic episodes - Ativan  PRN, PDMP reviewed, refilled Overall well-controlled Did not tolerate SSRI in the past Uses marijuana at times, strongly advised to avoid marijuana use as he is taking BZD      Relevant Medications   LORazepam  (ATIVAN ) 0.5 MG tablet   Right hand pain   Right hand pain and stiffness - Waters x-ray of right hand If signs of degenerative changes or erosion noted, will obtain initial autoimmune workup Advised  to apply Voltaren gel as needed for pain Advised to perform simple hand exercises      Relevant Orders   DG Hand Complete Right     Meds ordered this encounter  Medications   LORazepam  (ATIVAN ) 0.5 MG tablet    Sig: Take 1 tablet (0.5 mg total) by mouth 2 (two) times daily as needed. for anxiety    Dispense:  30 tablet    Refill:  1   cetirizine  (ZYRTEC ) 10 MG tablet    Sig: Take 1 tablet (10 mg total) by mouth daily.     Dispense:  30 tablet    Refill:  11    Follow-up: Return in about 6 months (around 12/16/2024).    Suzzane MARLA Blanch, MD

## 2024-06-26 ENCOUNTER — Encounter: Payer: Self-pay | Admitting: Neurology

## 2024-06-29 ENCOUNTER — Telehealth: Payer: Self-pay

## 2024-06-29 NOTE — Telephone Encounter (Signed)
 AUTH NUMBER [id number] Valid form 06/26/24 until 08/10/24

## 2024-06-30 ENCOUNTER — Encounter: Payer: Self-pay | Admitting: Neurology

## 2024-07-02 ENCOUNTER — Ambulatory Visit
Admission: RE | Admit: 2024-07-02 | Discharge: 2024-07-02 | Disposition: A | Discharge status: Home / Self Care | Source: Ambulatory Visit | Attending: Neurology | Admitting: Neurology

## 2024-07-02 DIAGNOSIS — G35D Multiple sclerosis, unspecified: Secondary | ICD-10-CM

## 2024-07-02 MED ORDER — GADOPICLENOL 0.5 MMOL/ML IV SOLN
8.0000 mL | Freq: Once | INTRAVENOUS | Status: AC | PRN
Start: 2024-07-02 — End: 2024-07-02
  Administered 2024-07-02: 8 mL via INTRAVENOUS

## 2024-07-06 ENCOUNTER — Ambulatory Visit: Payer: Self-pay | Admitting: Neurology

## 2024-07-08 NOTE — Telephone Encounter (Signed)
 Pt called an informed that brain MRI looks good, no changes from last year,no tumor, stroke, bleed,or inflammation seen

## 2024-07-08 NOTE — Telephone Encounter (Signed)
-----   Message from Darice CHRISTELLA Shivers sent at 07/06/2024  2:00 AM EST ----- Pls let him know brain MRI looks good, no changes from last year,no tumor, stroke, bleed,or inflammation seen. Thanks ----- Message ----- From: Interface, Rad Results In Sent: 07/05/2024   9:05 PM EST To: Darice CHRISTELLA Shivers, MD

## 2024-09-09 ENCOUNTER — Telehealth (INDEPENDENT_AMBULATORY_CARE_PROVIDER_SITE_OTHER): Payer: Self-pay | Admitting: Family Medicine

## 2024-09-09 ENCOUNTER — Encounter: Payer: Self-pay | Admitting: Family Medicine

## 2024-09-09 DIAGNOSIS — K529 Noninfective gastroenteritis and colitis, unspecified: Secondary | ICD-10-CM | POA: Diagnosis not present

## 2024-09-09 MED ORDER — ONDANSETRON 4 MG PO TBDP: 4.0000 mg | ORAL_TABLET | Freq: Three times a day (TID) | ORAL | 0 refills | Status: AC | PRN

## 2024-09-09 NOTE — Progress Notes (Signed)
 Virtual Visit via Telephone Note  I connected with Benjamin Waters on 09/09/24 at  8:40 AM EST by telephone and verified that I am speaking with the correct person using two identifiers.  Location: Patient: home Provider: office   I discussed the limitations, risks, security and privacy concerns of performing an evaluation and management service by telephone and the availability of in person appointments. I also discussed with the patient that there may be a patient responsible charge related to this service. The patient expressed understanding and agreed to proceed.   History of Present Illness:   V/ D around middday yesterday chills, , 12hrs, 1 BM,  v omit x 4 , nausea , tired and feels  dehydrated , still nauseated ,unable to work today based on symptoms but since improving as far as symptoms are concerned, anticipated return to work in am, requests work note for 2 datys , yesterday and today Observations/Objective: There were no vitals taken for this visit. Good communication with no confusion and intact memory. Alert  No signs of respiratory distress during speech   Assessment and Plan: Acute gastroenteritis 12 hpour duration of severe symptoms, appears to be resolving , though feels weak and dehydrated, main symptom was vo,iting Zofran  prescribed for nausea if needed  Educated re importance of hydration Return to work on 09/10/2024 Urgent care or ED if condition worsens   Follow Up Instructions:    I discussed the assessment and treatment plan with the patient. The patient was provided an opportunity to ask questions and all were answered. The patient agreed with the plan and demonstrated an understanding of the instructions.   The patient was advised to call back or seek an in-person evaluation if the symptoms worsen or if the condition fails to improve as anticipated.  I provided 12 minutes of non-face-to-face time during this encounter.   Rollene Pesa, MD

## 2024-09-09 NOTE — Assessment & Plan Note (Signed)
 12 hpour duration of severe symptoms, appears to be resolving , though feels weak and dehydrated, main symptom was vo,iting Zofran  prescribed for nausea if needed  Educated re importance of hydration Return to work on 09/10/2024 Urgent care or ED if condition worsens

## 2024-09-09 NOTE — Patient Instructions (Addendum)
 F/U I with PCP as before  Work excuse  to be emailed patient , 1 /27 to return 09/10/2024  Zofran  is being sent in for nausea , please fill if needed Ensure you drink liquid throughout the day as able to prevent dehydration  If symptoms reoccur or worsen and  if  you start feeling weaker you need to go to the Emergency Room  Thanks for choosing [facility], we consider it a privelige to serve you.

## 2024-12-16 ENCOUNTER — Ambulatory Visit: Admitting: Internal Medicine

## 2025-06-01 ENCOUNTER — Ambulatory Visit: Admitting: Neurology
